# Patient Record
Sex: Female | Born: 1960 | Race: White | Hispanic: No | Marital: Married | Smoking: Former smoker
Health system: Southern US, Community
[De-identification: ages and names within clinical notes are randomized; demographics above are authoritative.]

## PROBLEM LIST (undated history)

## (undated) DIAGNOSIS — E785 Hyperlipidemia, unspecified: Secondary | ICD-10-CM

## (undated) DIAGNOSIS — Z9189 Other specified personal risk factors, not elsewhere classified: Secondary | ICD-10-CM

## (undated) DIAGNOSIS — R112 Nausea with vomiting, unspecified: Secondary | ICD-10-CM

## (undated) DIAGNOSIS — B372 Candidiasis of skin and nail: Secondary | ICD-10-CM

## (undated) DIAGNOSIS — F419 Anxiety disorder, unspecified: Secondary | ICD-10-CM

## (undated) DIAGNOSIS — G43909 Migraine, unspecified, not intractable, without status migrainosus: Secondary | ICD-10-CM

## (undated) DIAGNOSIS — Z8041 Family history of malignant neoplasm of ovary: Secondary | ICD-10-CM

## (undated) DIAGNOSIS — Z9889 Other specified postprocedural states: Secondary | ICD-10-CM

## (undated) DIAGNOSIS — G4733 Obstructive sleep apnea (adult) (pediatric): Secondary | ICD-10-CM

## (undated) DIAGNOSIS — Z973 Presence of spectacles and contact lenses: Secondary | ICD-10-CM

## (undated) DIAGNOSIS — M1990 Unspecified osteoarthritis, unspecified site: Secondary | ICD-10-CM

## (undated) DIAGNOSIS — I10 Essential (primary) hypertension: Secondary | ICD-10-CM

## (undated) HISTORY — PX: TONSILLECTOMY: SUR1361

---

## 1998-06-08 ENCOUNTER — Other Ambulatory Visit: Admission: RE | Admit: 1998-06-08 | Discharge: 1998-06-08 | Payer: Self-pay | Admitting: Obstetrics and Gynecology

## 1999-09-19 ENCOUNTER — Other Ambulatory Visit: Admission: RE | Admit: 1999-09-19 | Discharge: 1999-09-19 | Payer: Self-pay | Admitting: Obstetrics and Gynecology

## 2000-08-20 ENCOUNTER — Encounter: Payer: Self-pay | Admitting: Family Medicine

## 2000-08-20 ENCOUNTER — Encounter: Admission: RE | Admit: 2000-08-20 | Discharge: 2000-08-20 | Payer: Self-pay | Admitting: Family Medicine

## 2000-12-02 ENCOUNTER — Other Ambulatory Visit: Admission: RE | Admit: 2000-12-02 | Discharge: 2000-12-02 | Payer: Self-pay | Admitting: Obstetrics and Gynecology

## 2002-05-18 ENCOUNTER — Encounter: Payer: Self-pay | Admitting: Family Medicine

## 2002-05-18 ENCOUNTER — Encounter: Admission: RE | Admit: 2002-05-18 | Discharge: 2002-05-18 | Payer: Self-pay | Admitting: Family Medicine

## 2002-12-13 ENCOUNTER — Ambulatory Visit (HOSPITAL_COMMUNITY): Admission: RE | Admit: 2002-12-13 | Discharge: 2002-12-13 | Payer: Self-pay | Admitting: Family Medicine

## 2002-12-13 ENCOUNTER — Encounter: Payer: Self-pay | Admitting: Family Medicine

## 2003-01-07 ENCOUNTER — Other Ambulatory Visit: Admission: RE | Admit: 2003-01-07 | Discharge: 2003-01-07 | Payer: Self-pay | Admitting: Obstetrics and Gynecology

## 2003-10-13 ENCOUNTER — Encounter: Admission: RE | Admit: 2003-10-13 | Discharge: 2003-10-13 | Payer: Self-pay | Admitting: Family Medicine

## 2004-02-29 ENCOUNTER — Other Ambulatory Visit: Admission: RE | Admit: 2004-02-29 | Discharge: 2004-02-29 | Payer: Self-pay | Admitting: Obstetrics and Gynecology

## 2004-03-11 ENCOUNTER — Emergency Department (HOSPITAL_COMMUNITY): Admission: EM | Admit: 2004-03-11 | Discharge: 2004-03-11 | Payer: Self-pay | Admitting: Emergency Medicine

## 2004-08-14 ENCOUNTER — Other Ambulatory Visit: Admission: RE | Admit: 2004-08-14 | Discharge: 2004-08-14 | Payer: Self-pay | Admitting: Obstetrics and Gynecology

## 2005-09-13 ENCOUNTER — Encounter: Admission: RE | Admit: 2005-09-13 | Discharge: 2005-09-13 | Payer: Self-pay | Admitting: Family Medicine

## 2006-04-14 ENCOUNTER — Encounter: Admission: RE | Admit: 2006-04-14 | Discharge: 2006-04-14 | Payer: Self-pay | Admitting: Orthopaedic Surgery

## 2006-06-05 ENCOUNTER — Encounter: Admission: RE | Admit: 2006-06-05 | Discharge: 2006-06-05 | Payer: Self-pay | Admitting: Orthopaedic Surgery

## 2006-10-04 ENCOUNTER — Encounter: Admission: RE | Admit: 2006-10-04 | Discharge: 2006-10-04 | Payer: Self-pay | Admitting: Orthopaedic Surgery

## 2006-11-24 ENCOUNTER — Encounter: Admission: RE | Admit: 2006-11-24 | Discharge: 2006-11-24 | Payer: Self-pay | Admitting: Orthopaedic Surgery

## 2006-12-18 ENCOUNTER — Encounter: Admission: RE | Admit: 2006-12-18 | Discharge: 2006-12-18 | Payer: Self-pay | Admitting: Orthopaedic Surgery

## 2007-12-16 ENCOUNTER — Encounter: Admission: RE | Admit: 2007-12-16 | Discharge: 2007-12-16 | Payer: Self-pay | Admitting: Family Medicine

## 2008-01-13 ENCOUNTER — Encounter: Admission: RE | Admit: 2008-01-13 | Discharge: 2008-01-13 | Payer: Self-pay | Admitting: Family Medicine

## 2008-02-16 ENCOUNTER — Encounter: Admission: RE | Admit: 2008-02-16 | Discharge: 2008-02-16 | Payer: Self-pay | Admitting: Family Medicine

## 2009-07-24 ENCOUNTER — Encounter: Admission: RE | Admit: 2009-07-24 | Discharge: 2009-07-24 | Payer: Self-pay | Admitting: Family Medicine

## 2010-08-22 ENCOUNTER — Encounter: Payer: Self-pay | Admitting: Sports Medicine

## 2010-09-12 ENCOUNTER — Ambulatory Visit: Payer: Self-pay | Admitting: Sports Medicine

## 2010-09-12 DIAGNOSIS — M7750 Other enthesopathy of unspecified foot: Secondary | ICD-10-CM | POA: Insufficient documentation

## 2010-09-12 DIAGNOSIS — M79609 Pain in unspecified limb: Secondary | ICD-10-CM | POA: Insufficient documentation

## 2010-10-28 ENCOUNTER — Encounter: Payer: Self-pay | Admitting: Orthopaedic Surgery

## 2010-11-06 NOTE — Letter (Signed)
Summary: *Consult Note  [facility]  [address]   [place], [place] [address]   Phone: [phone]  Fax: [phone]    Re:    Kayla Daugherty [facility] DOB:    [birth year] [name], MD [facility] Fax  [phone]  09/12/10   Dear Selena Batten:    Thank you for requesting that we see the above patient for consultation.  A copy of the detailed office note will be sent under separate cover, for your review.  Evaluation today is consistent with:  1)  METATARSALGIA (ICD-726.70) 2)  FOOT PAIN, RIGHT (ICD-729.5)   Our recommendation is for: MT pads and cushioned insoles in all shoes.  I doubt she will need custom orthotics unless this strategy failed.   New Orders include:  1)  Consultation Level II [40102]   Thank you for this consultation.  If you have any further questions regarding the care of this patient, please do not hesitate to contact me @ [phone].  Thank you for this opportunity to look after your patient.  Sincerely,  Vincent Gros MD

## 2010-11-06 NOTE — Assessment & Plan Note (Signed)
Summary: NP,RT FOOT PAIN,MC   Vital Signs:  Patient profile:   50 year old female Height:      66 inches Weight:      165 pounds BMI:     26.73 BP sitting:   116 / 88  Vitals Entered By: Lillia Pauls CMA (September 12, 2010 11:21 AM)   History of Present Illness: Hyperextended RT 2nd toe 1 mo ago  getting up from kneeling position pain at first on top now numbness and sxs on bottom distal to MT  walks a lot at work including steps [facility] - walks on concrete floors much of day - [employer]  Seen by Dr Clelia Croft who suspected metatarsalgia and sent her for my evaluation    Preventive Screening-Counseling & Management  Alcohol-Tobacco     Smoking Status: never  Allergies (verified): 1)  ! Tetracycline  Past History:  Past Medical History: HX of L5 disk probs that started 8 yrs ago has had injections  Meds - Topamax 100 mgm migraine prevention Valium - 2 mg for sleep  Social History: Smoking Status:  never  Physical Exam  General:  Well-developed,well-nourished,in no acute distress; alert,appropriate and cooperative throughout examination Msk:  fet show loss of transverse arch bilat there is no real splaying of toes but some early morton's callus bilat area of numbness on RT distal to MT heads 2,3 on palpation MT heads are prominent on 3,4 on RT and on 2,3 left  long arch is moderaetely flattened  no TTP over PF  gait show ext rotation of RT foot w pronation bilat   Impression & Recommendations:  Problem # 1:  FOOT PAIN, RIGHT (ICD-729.5) Assessment New This side is symptomatic and after placemnt of MT on sports insoles she had some relief of pain immediately  I would work on pad placement and did not advise any specific meds  Problem # 2:  METATARSALGIA (ICD-726.70) small MT pads added to sixe 7.5 sports insole  try these 1 mo if not responding we can recheck she is geven extra MT pads incase she needs to move left one slightly  if this strategy works  cont to use this type of support  given catologues to order these  copy to Dr Clelia Croft   Orders Added: 1)  Consultation Level II [10626]

## 2010-11-06 NOTE — Consult Note (Signed)
Summary: Kayla Daugherty at [facility]   Imported By: Marily Memos 08/27/2010 08:51:00  _____________________________________________________________________  External Attachment:    Type:   Image     Comment:   External Document

## 2011-05-09 ENCOUNTER — Emergency Department (HOSPITAL_COMMUNITY)
Admission: EM | Admit: 2011-05-09 | Discharge: 2011-05-09 | Disposition: A | Discharge status: Home / Self Care | Payer: BC Managed Care – PPO | Attending: Emergency Medicine | Admitting: Emergency Medicine

## 2011-05-09 ENCOUNTER — Emergency Department (HOSPITAL_COMMUNITY): Payer: BC Managed Care – PPO

## 2011-05-09 DIAGNOSIS — E876 Hypokalemia: Secondary | ICD-10-CM | POA: Insufficient documentation

## 2011-05-09 DIAGNOSIS — R0789 Other chest pain: Secondary | ICD-10-CM | POA: Insufficient documentation

## 2011-05-09 LAB — BASIC METABOLIC PANEL
BUN: 11 mg/dL (ref 6–23)
CO2: 22 mEq/L (ref 19–32)
Calcium: 9.4 mg/dL (ref 8.4–10.5)
Chloride: 104 mEq/L (ref 96–112)
Creatinine, Ser: 0.67 mg/dL (ref 0.50–1.10)
GFR calc Af Amer: >60 mL/min (ref >60)
GFR calc non Af Amer: >60 mL/min (ref >60)
Glucose, Bld: 103 mg/dL — ABNORMAL HIGH (ref 70–99)
Potassium: 3.1 mEq/L — ABNORMAL LOW (ref 3.5–5.1)
Sodium: 136 mEq/L (ref 135–145)

## 2011-05-09 LAB — DIFFERENTIAL
Basophils Absolute: 0 10*3/uL (ref 0.0–0.1)
Basophils Relative: 0 % (ref 0–1)
Eosinophils Absolute: 0.2 10*3/uL (ref 0.0–0.7)
Eosinophils Relative: 2 % (ref 0–5)
Lymphocytes Relative: 30 % (ref 12–46)
Lymphs Abs: 3 10*3/uL (ref 0.7–4.0)
Monocytes Absolute: 0.9 10*3/uL (ref 0.1–1.0)
Monocytes Relative: 9 % (ref 3–12)
Neutro Abs: 5.9 10*3/uL (ref 1.7–7.7)
Neutrophils Relative %: 59 % (ref 43–77)

## 2011-05-09 LAB — CBC
HCT: 34.4 % — ABNORMAL LOW (ref 36.0–46.0)
Hemoglobin: 11.5 g/dL — ABNORMAL LOW (ref 12.0–15.0)
MCH: 28.6 pg (ref 26.0–34.0)
MCHC: 33.4 g/dL (ref 30.0–36.0)
MCV: 85.6 fL (ref 78.0–100.0)
Platelets: 367 10*3/uL (ref 150–400)
RBC: 4.02 MIL/uL (ref 3.87–5.11)
RDW: 13.2 % (ref 11.5–15.5)
WBC: 10.1 10*3/uL (ref 4.0–10.5)

## 2011-05-09 LAB — PROTIME-INR
INR: 1.07 (ref 0.00–1.49)
Prothrombin Time: 14.1 seconds (ref 11.6–15.2)

## 2011-05-09 LAB — CK TOTAL AND CKMB (NOT AT ARMC)
CK, MB: 1.6 ng/mL (ref 0.3–4.0)
Relative Index: INVALID (ref 0.0–2.5)
Total CK: 99 U/L (ref 7–177)

## 2011-05-09 LAB — TROPONIN I: Troponin I: <0.3 ng/mL (ref <0.30)

## 2011-10-08 HISTORY — PX: COLONOSCOPY: SHX174

## 2013-09-27 ENCOUNTER — Other Ambulatory Visit: Payer: Self-pay | Admitting: Obstetrics and Gynecology

## 2013-09-27 DIAGNOSIS — Z803 Family history of malignant neoplasm of breast: Secondary | ICD-10-CM

## 2013-10-06 ENCOUNTER — Ambulatory Visit
Admission: RE | Admit: 2013-10-06 | Discharge: 2013-10-06 | Disposition: A | Discharge status: Home / Self Care | Payer: BC Managed Care – PPO | Source: Ambulatory Visit | Attending: Obstetrics and Gynecology | Admitting: Obstetrics and Gynecology

## 2013-10-06 DIAGNOSIS — Z803 Family history of malignant neoplasm of breast: Secondary | ICD-10-CM

## 2013-10-06 MED ORDER — GADOBENATE DIMEGLUMINE 529 MG/ML IV SOLN
15.0000 mL | Freq: Once | INTRAVENOUS | Status: AC | PRN
  Administered 2013-10-06: 15 mL via INTRAVENOUS

## 2013-11-05 ENCOUNTER — Other Ambulatory Visit: Payer: Self-pay | Admitting: Family Medicine

## 2013-11-05 ENCOUNTER — Ambulatory Visit
Admission: RE | Admit: 2013-11-05 | Discharge: 2013-11-05 | Disposition: A | Discharge status: Home / Self Care | Payer: BC Managed Care – PPO | Source: Ambulatory Visit | Attending: Family Medicine | Admitting: Family Medicine

## 2013-11-05 DIAGNOSIS — M542 Cervicalgia: Secondary | ICD-10-CM

## 2013-11-18 ENCOUNTER — Other Ambulatory Visit: Payer: Self-pay | Admitting: Family Medicine

## 2013-11-18 DIAGNOSIS — M542 Cervicalgia: Secondary | ICD-10-CM

## 2013-11-21 ENCOUNTER — Ambulatory Visit
Admission: RE | Admit: 2013-11-21 | Discharge: 2013-11-21 | Disposition: A | Discharge status: Home / Self Care | Payer: BC Managed Care – PPO | Source: Ambulatory Visit | Attending: Family Medicine | Admitting: Family Medicine

## 2013-11-21 DIAGNOSIS — M542 Cervicalgia: Secondary | ICD-10-CM

## 2013-11-30 ENCOUNTER — Other Ambulatory Visit: Payer: BC Managed Care – PPO

## 2014-02-15 ENCOUNTER — Ambulatory Visit
Admission: RE | Admit: 2014-02-15 | Discharge: 2014-02-15 | Disposition: A | Discharge status: Home / Self Care | Payer: BC Managed Care – PPO | Source: Ambulatory Visit | Attending: Family Medicine | Admitting: Family Medicine

## 2014-02-15 ENCOUNTER — Other Ambulatory Visit: Payer: Self-pay | Admitting: Family Medicine

## 2014-02-15 DIAGNOSIS — R52 Pain, unspecified: Secondary | ICD-10-CM

## 2014-05-14 ENCOUNTER — Encounter (HOSPITAL_BASED_OUTPATIENT_CLINIC_OR_DEPARTMENT_OTHER): Payer: Self-pay | Admitting: Emergency Medicine

## 2014-05-14 ENCOUNTER — Emergency Department (HOSPITAL_BASED_OUTPATIENT_CLINIC_OR_DEPARTMENT_OTHER)
Admission: EM | Admit: 2014-05-14 | Discharge: 2014-05-14 | Disposition: A | Discharge status: Home / Self Care | Payer: BC Managed Care – PPO | Attending: Emergency Medicine | Admitting: Emergency Medicine

## 2014-05-14 DIAGNOSIS — H5710 Ocular pain, unspecified eye: Secondary | ICD-10-CM | POA: Insufficient documentation

## 2014-05-14 DIAGNOSIS — G43909 Migraine, unspecified, not intractable, without status migrainosus: Secondary | ICD-10-CM | POA: Insufficient documentation

## 2014-05-14 DIAGNOSIS — Z79899 Other long term (current) drug therapy: Secondary | ICD-10-CM | POA: Insufficient documentation

## 2014-05-14 DIAGNOSIS — H5704 Mydriasis: Secondary | ICD-10-CM

## 2014-05-14 HISTORY — DX: Migraine, unspecified, not intractable, without status migrainosus: G43.909

## 2014-05-14 NOTE — ED Notes (Signed)
Patient states that yesterday she had a headache and she noticed her pupils were dilated. Saw her employee nurse who gave her medication for a migraine and told her to be seen in the ER if her pupils were still large today.

## 2014-05-14 NOTE — Discharge Instructions (Signed)

## 2014-05-14 NOTE — ED Provider Notes (Signed)
CSN: [id number]     Arrival date & time 05/14/14  1507 History  This chart was scribed for Toy CookeyMegan Marjon Doxtater, MD by Elon SpannerGarrett Cook, ED Scribe. This patient was seen in room MH02/MH02 and the patient's care was started at 4:16 PM.    Chief Complaint  Patient presents with  . Eye Problem    Patient is a 53 y.o. female presenting with eye problem. The history is provided by the patient. No language interpreter was used.  Eye Problem Location:  Both Severity:  Moderate Onset quality:  Sudden Duration:  1 day Timing:  Constant Progression:  Unchanged Chronicity:  New Relieved by:  Nothing Worsened by:  Nothing tried Ineffective treatments:  None tried (Lortab and prednisone) Associated symptoms: no discharge, no headaches, no nausea, no numbness, no photophobia, no vomiting and no weakness     HPI Comments: Kayla Daugherty is a 53 y.o. female with a history of migraines who presents to the Emergency Department complaining of a constant, unchanged eye problem that began yesterday morning.  Patient states that she noticed her eye was bulging yesterday morning and went to her work-associated clinic where she was prescribed Lortab and Prednisone.  She was told to follow-up in the ED and ask for a CT-scan if the problem did not resolve.  Patient states that she has also been she has been experiencing a headache for approximately 3 days that has currently resolved.  Patient is prescribed medication for chronic migraines and states she is compliant.  The patient notes that while her headache has subsided, she is currently experiencing mild pressure bilaterally on her eye brows.  Patient denies recent changes in her prescribed medications.  Patient denies numbness, ataxia, dysphagia, fever, cough, swallowing, difficulty speaking, recent surgery, other medical problems.    Past Medical History  Diagnosis Date  . Migraines    History reviewed. No pertinent past surgical history. No family history on  file. History  Substance Use Topics  . Smoking status: Not on file  . Smokeless tobacco: Not on file  . Alcohol Use: Not on file   OB History   Grav Para Term Preterm Abortions TAB SAB Ect Mult Living                 Review of Systems  Constitutional: Negative for fever, chills, diaphoresis, activity change, appetite change and fatigue.  HENT: Negative for congestion, facial swelling, rhinorrhea and sore throat.   Eyes: Positive for pain. Negative for photophobia and discharge.  Respiratory: Negative for cough, chest tightness and shortness of breath.   Cardiovascular: Negative for chest pain, palpitations and leg swelling.  Gastrointestinal: Negative for nausea, vomiting, abdominal pain and diarrhea.  Endocrine: Negative for polydipsia and polyuria.  Genitourinary: Negative for dysuria, frequency, difficulty urinating and pelvic pain.  Musculoskeletal: Negative for arthralgias, back pain, neck pain and neck stiffness.  Skin: Negative for color change and wound.  Allergic/Immunologic: Negative for immunocompromised state.  Neurological: Negative for facial asymmetry, speech difficulty, weakness, numbness and headaches.  Hematological: Does not bruise/bleed easily.  Psychiatric/Behavioral: Negative for confusion and agitation.      Allergies  Tetracycline  Home Medications   Prior to Admission medications   Medication Sig Start Date End Date Taking? Authorizing Provider  clonazePAM (KLONOPIN) 1 MG tablet Take 1 mg by mouth 2 (two) times daily.   Yes Historical Provider, MD   BP 121/79  Pulse 63  Temp(Src) 97.9 F (36.6 C) (Oral)  Resp 12  Ht 5\' 6"  (1.676  m)  Wt 170 lb (77.111 kg)  BMI 27.45 kg/m2  SpO2 98% Physical Exam  Nursing note and vitals reviewed. Constitutional: She is oriented to person, place, and time. She appears well-developed and well-nourished. No distress.  HENT:  Head: Normocephalic.  Mouth/Throat: Oropharynx is clear and moist.  Eyes:  Conjunctivae and EOM are normal. Pupils are equal, round, and reactive to light. Pupils are equal.  3 mm bilaterally.  Reactive equally.   Neck: Neck supple.  Cardiovascular: Normal rate, regular rhythm and normal heart sounds.   Pulmonary/Chest: Effort normal and breath sounds normal. No respiratory distress. She has no wheezes.  Abdominal: Soft. She exhibits no distension. There is no tenderness. There is no rebound and no guarding.  Musculoskeletal: She exhibits no edema and no tenderness.  Neurological: She is alert and oriented to person, place, and time. She displays no atrophy and no tremor. No cranial nerve deficit or sensory deficit. She exhibits normal muscle tone. She displays no seizure activity. Coordination and gait normal. GCS eye subscore is 4. GCS verbal subscore is 5. GCS motor subscore is 6.  Skin: Skin is warm and dry.  Psychiatric: She has a normal mood and affect.    ED Course  Procedures (including critical care time)  DIAGNOSTIC STUDIES: Oxygen Saturation is 99% on RA, normal by my interpretation.    COORDINATION OF CARE:  4:25 PM Discussed normal neurological exam findings.  Advised patient of return precautions.  Patient acknowledges and agrees with plan.      Labs Review Labs Reviewed - No data to display  Imaging Review No results found.   EKG Interpretation None      MDM   Final diagnoses:  Pupil dilation    Pt is a 53 y.o. female with Pmhx as above who states she was sent by her work Engineer, civil (consulting) to evaluate BL dialated pupils. She states she had a migraine yesterday treated w/ Vicodin and steroids which is much better today, but that she was told if her pupils she still dilated today, she needed to go to the ED. She denies visual changes, numbness, weakness, confusion, trouble speaking or walking, fevers. She has had no trauma.  Her h/a is now mild. On PE, VSS, pt in NAD. Pupils are 3mm, equal, round and reactive to light w/ nml EOM.  She has no focal  neuro findings. I do not feel she has an intracranial lesion/mass effect that would explain BL pupillary dilation w/o headache or focal neuro findings and therefore do not feel she requires neuro imaging. I feel this mydriasis is benign, likely medication related. Return precautions given for new or worsening symptoms including worsening h/a, focal neuro complaints.    I personally performed the services described in this documentation, which was scribed in my presence. The recorded information has been reviewed and is accurate.     Toy Cookey, MD 05/14/14 2049

## 2014-05-31 ENCOUNTER — Other Ambulatory Visit: Payer: Self-pay | Admitting: Family Medicine

## 2014-05-31 DIAGNOSIS — R51 Headache: Secondary | ICD-10-CM

## 2014-05-31 DIAGNOSIS — H5704 Mydriasis: Secondary | ICD-10-CM

## 2014-06-06 ENCOUNTER — Ambulatory Visit
Admission: RE | Admit: 2014-06-06 | Discharge: 2014-06-06 | Disposition: A | Discharge status: Home / Self Care | Payer: BC Managed Care – PPO | Source: Ambulatory Visit | Attending: Family Medicine | Admitting: Family Medicine

## 2014-06-06 DIAGNOSIS — H5704 Mydriasis: Secondary | ICD-10-CM

## 2014-06-06 DIAGNOSIS — R51 Headache: Secondary | ICD-10-CM

## 2014-06-06 MED ORDER — IOHEXOL 300 MG/ML  SOLN
75.0000 mL | Freq: Once | INTRAMUSCULAR | Status: AC | PRN
  Administered 2014-06-06: 75 mL via INTRAVENOUS

## 2015-01-04 ENCOUNTER — Encounter (HOSPITAL_BASED_OUTPATIENT_CLINIC_OR_DEPARTMENT_OTHER): Payer: Self-pay | Admitting: *Deleted

## 2015-01-04 NOTE — H&P (Signed)
  Patient name  Kayla Daugherty, Kayla Daugherty DICTATION#  [id number] CSN# [id number]  Kayla Daugherty,Kayla Lofaso S, MD 01/04/2015 10:27 AM

## 2015-01-04 NOTE — Progress Notes (Signed)
NPO AFTER MN. ARRIVE AT 0600. WILL GET LAB DONE Friday 01-06-2015.  MAY TAKE IMITREX IF NEEDED AM DOS W/ SIPS OF WATER.

## 2015-01-05 NOTE — H&P (Signed)
Kayla Daugherty, RODOCKER NO.:  [id number]  MEDICAL RECORD NO.:  [id number]  LOCATION:                                 FACILITY:  PHYSICIAN:  Darlyn Chamber, M.D.   DATE OF BIRTH:  [birth year]  DATE OF ADMISSION:  01/10/2015 DATE OF DISCHARGE:                             HISTORY & PHYSICAL   Her surgery is scheduled for January 10, 2015, at [facility] outpatient area on [address].  HISTORY OF PRESENT ILLNESS:  The patient is a 54 year old nulligravida female who presents for laparoscopic bilateral salpingo-oophorectomy. Indications for this.  She did have a paternal aunt with breast and ovarian cancer.  Had another paternal aunt with breast cancer.  She did undergo BRCA testing was negative.  She has been doing ultrasounds and CA-125 every 6 months that was negative.  He has decided at the present time due to her increased risk of ovarian cancer.  She undergo laparoscopic bilateral salpingo-oophorectomy.  The option of adding hysterectomy was discussed along with its risks and benefits.  She declines that at the present time.  ALLERGIES:  She is allergic to TETRACYCLINE.  MEDICATIONS:  She is on simvastatin 20 mg daily,  Klonopin 1 mg daily, topiramate 50 mg, and Zoloft.  PAST MEDICAL HISTORY:  Does have a history of hyperlipidemia, under active management.  Also, a history of migraine headaches.  PREVIOUS SURGERIES:  Tonsillectomy.  SOCIAL HISTORY:  Reveals no tobacco or alcohol use.  FAMILY HISTORY:  Father with a history of diverticulosis and peptic ulcer disease.  REVIEW OF SYSTEMS:  Noncontributory.  PHYSICAL EXAM:  VITAL SIGNS:  The patient is afebrile, stable vital signs. HEENT:  The patient is normocephalic.  Pupils equal, round, and reactive to light and accommodation.  Extraocular movements were intact.  Sclerae and conjunctivae are clear.  Oropharynx clear. NECK:  Without thyromegaly. BREASTS:  No discrete masses. LUNGS:   Clear. CARDIOVASCULAR:  Regular rhythm and rate.  There are no murmurs or gallops.  No carotid or abdominal bruits. ABDOMEN:  Benign.  No mass, organomegaly, or tenderness. PELVIC:  Normal external genitalia.  Vaginal mucosa is clear.  Cervix unremarkable.  Uterus normal size, shape, and contour.  Adnexa free of mass or tenderness. EXTREMITIES:  Trace edema.  NEUROLOGIC:  Grossly within normal limits.  IMPRESSION:  Family history of both breast and ovarian cancer given the patient new increased risk of ovarian cancer.  PLAN:  The patient will undergo laparoscopic and bilateral salpingo- oophorectomy.  Uterus with a left in place per patient's request.  The risks of surgery have been explained including the risk of infection. The risk of hemorrhage that could require transfusion with the risk of AIDS or hepatitis.  Risk of injury to adjacent organs including bladder, bowel, ureters that could require further exploratory surgery.  Risk of deep venous thrombosis and pulmonary embolus.  Alternatives such as continued ultrasounds and CA-125 have been discussed.  Their limitations have been explained.  We are going to do a cystoscopy after the procedure to make sure the bladder, ureters, all packed.  The patient expressed understanding of indications, risks, and alternatives.     Darlyn Chamber, M.D.  JSM/MEDQ  D:  01/04/2015  T:  01/04/2015  Job:  [id number]

## 2015-01-06 ENCOUNTER — Other Ambulatory Visit (HOSPITAL_COMMUNITY)
Admission: RE | Admit: 2015-01-06 | Discharge: 2015-01-06 | Disposition: A | Discharge status: Home / Self Care | Payer: BLUE CROSS/BLUE SHIELD | Source: Ambulatory Visit | Attending: Obstetrics and Gynecology | Admitting: Obstetrics and Gynecology

## 2015-01-06 DIAGNOSIS — Z419 Encounter for procedure for purposes other than remedying health state, unspecified: Secondary | ICD-10-CM | POA: Insufficient documentation

## 2015-01-06 LAB — CBC
HCT: 39.2 % (ref 36.0–46.0)
Hemoglobin: 13.3 g/dL (ref 12.0–15.0)
MCH: 31.1 pg (ref 26.0–34.0)
MCHC: 33.9 g/dL (ref 30.0–36.0)
MCV: 91.6 fL (ref 78.0–100.0)
Platelets: 357 10*3/uL (ref 150–400)
RBC: 4.28 MIL/uL (ref 3.87–5.11)
RDW: 12.5 % (ref 11.5–15.5)
WBC: 8.7 10*3/uL (ref 4.0–10.5)

## 2015-01-06 LAB — COMPREHENSIVE METABOLIC PANEL
ALT: 37 U/L — ABNORMAL HIGH (ref 0–35)
AST: 29 U/L (ref 0–37)
Albumin: 4.6 g/dL (ref 3.5–5.2)
Alkaline Phosphatase: 86 U/L (ref 39–117)
Anion gap: 12 (ref 5–15)
BUN: 13 mg/dL (ref 6–23)
CO2: 25 mmol/L (ref 19–32)
Calcium: 9.7 mg/dL (ref 8.4–10.5)
Chloride: 104 mmol/L (ref 96–112)
Creatinine, Ser: 0.9 mg/dL (ref 0.50–1.10)
GFR calc Af Amer: 83 mL/min — ABNORMAL LOW (ref >90)
GFR calc non Af Amer: 72 mL/min — ABNORMAL LOW (ref >90)
Glucose, Bld: 90 mg/dL (ref 70–99)
Potassium: 4 mmol/L (ref 3.5–5.1)
Sodium: 141 mmol/L (ref 135–145)
Total Bilirubin: 0.3 mg/dL (ref 0.3–1.2)
Total Protein: 8 g/dL (ref 6.0–8.3)

## 2015-01-10 ENCOUNTER — Encounter (HOSPITAL_BASED_OUTPATIENT_CLINIC_OR_DEPARTMENT_OTHER)
Admission: RE | Disposition: A | Discharge status: Home / Self Care | Payer: Self-pay | Source: Ambulatory Visit | Attending: Obstetrics and Gynecology

## 2015-01-10 ENCOUNTER — Encounter (HOSPITAL_BASED_OUTPATIENT_CLINIC_OR_DEPARTMENT_OTHER): Payer: Self-pay | Admitting: *Deleted

## 2015-01-10 ENCOUNTER — Ambulatory Visit (HOSPITAL_BASED_OUTPATIENT_CLINIC_OR_DEPARTMENT_OTHER)
Admission: RE | Admit: 2015-01-10 | Discharge: 2015-01-10 | Disposition: A | Discharge status: Home / Self Care | Payer: BLUE CROSS/BLUE SHIELD | Source: Ambulatory Visit | Attending: Obstetrics and Gynecology | Admitting: Obstetrics and Gynecology

## 2015-01-10 ENCOUNTER — Ambulatory Visit (HOSPITAL_BASED_OUTPATIENT_CLINIC_OR_DEPARTMENT_OTHER): Payer: BLUE CROSS/BLUE SHIELD | Admitting: Anesthesiology

## 2015-01-10 DIAGNOSIS — Z803 Family history of malignant neoplasm of breast: Secondary | ICD-10-CM | POA: Insufficient documentation

## 2015-01-10 DIAGNOSIS — Z87891 Personal history of nicotine dependence: Secondary | ICD-10-CM | POA: Diagnosis not present

## 2015-01-10 DIAGNOSIS — Z8041 Family history of malignant neoplasm of ovary: Secondary | ICD-10-CM | POA: Insufficient documentation

## 2015-01-10 DIAGNOSIS — Z4002 Encounter for prophylactic removal of ovary: Secondary | ICD-10-CM | POA: Diagnosis present

## 2015-01-10 DIAGNOSIS — G43909 Migraine, unspecified, not intractable, without status migrainosus: Secondary | ICD-10-CM | POA: Diagnosis not present

## 2015-01-10 DIAGNOSIS — E785 Hyperlipidemia, unspecified: Secondary | ICD-10-CM | POA: Insufficient documentation

## 2015-01-10 DIAGNOSIS — Z90722 Acquired absence of ovaries, bilateral: Secondary | ICD-10-CM

## 2015-01-10 DIAGNOSIS — Z9889 Other specified postprocedural states: Secondary | ICD-10-CM

## 2015-01-10 DIAGNOSIS — G4733 Obstructive sleep apnea (adult) (pediatric): Secondary | ICD-10-CM | POA: Diagnosis not present

## 2015-01-10 HISTORY — PX: LAPAROSCOPIC SALPINGO OOPHERECTOMY: SHX5927

## 2015-01-10 HISTORY — DX: Presence of spectacles and contact lenses: Z97.3

## 2015-01-10 HISTORY — PX: CYSTOSCOPY: SHX5120

## 2015-01-10 HISTORY — DX: Other specified personal risk factors, not elsewhere classified: Z91.89

## 2015-01-10 HISTORY — DX: Family history of malignant neoplasm of ovary: Z80.41

## 2015-01-10 HISTORY — DX: Candidiasis of skin and nail: B37.2

## 2015-01-10 HISTORY — DX: Hyperlipidemia, unspecified: E78.5

## 2015-01-10 HISTORY — DX: Obstructive sleep apnea (adult) (pediatric): G47.33

## 2015-01-10 SURGERY — SALPINGO-OOPHORECTOMY, LAPAROSCOPIC
Anesthesia: General | Site: Bladder | Laterality: Bilateral

## 2015-01-10 MED ORDER — HYDROMORPHONE HCL 1 MG/ML IJ SOLN
INTRAMUSCULAR | Status: AC
  Filled 2015-01-10: qty 1

## 2015-01-10 MED ORDER — NEOSTIGMINE METHYLSULFATE 10 MG/10ML IV SOLN
INTRAVENOUS | Status: DC | PRN
  Administered 2015-01-10: 3 mg via INTRAVENOUS

## 2015-01-10 MED ORDER — FLUORESCEIN SODIUM 10 % IJ SOLN
INTRAMUSCULAR | Status: DC | PRN
  Administered 2015-01-10: 500 mg via INTRAVENOUS

## 2015-01-10 MED ORDER — SODIUM CHLORIDE 0.9 % IR SOLN
Status: DC | PRN
  Administered 2015-01-10: 1000 mL

## 2015-01-10 MED ORDER — CEFAZOLIN SODIUM-DEXTROSE 2-3 GM-% IV SOLR
2.0000 g | INTRAVENOUS | Status: AC
  Administered 2015-01-10: 2 g via INTRAVENOUS
  Filled 2015-01-10: qty 50

## 2015-01-10 MED ORDER — FENTANYL CITRATE 0.05 MG/ML IJ SOLN
INTRAMUSCULAR | Status: DC | PRN
  Administered 2015-01-10 (×2): 50 ug via INTRAVENOUS
  Administered 2015-01-10 (×2): 25 ug via INTRAVENOUS
  Administered 2015-01-10: 50 ug via INTRAVENOUS

## 2015-01-10 MED ORDER — HYDROMORPHONE HCL 1 MG/ML IJ SOLN
0.2500 mg | INTRAMUSCULAR | Status: DC | PRN
  Administered 2015-01-10 (×2): 0.25 mg via INTRAVENOUS
  Filled 2015-01-10: qty 1

## 2015-01-10 MED ORDER — LACTATED RINGERS IV SOLN
INTRAVENOUS | Status: DC
  Administered 2015-01-10 (×3): via INTRAVENOUS
  Filled 2015-01-10: qty 1000

## 2015-01-10 MED ORDER — SUCCINYLCHOLINE CHLORIDE 20 MG/ML IJ SOLN
INTRAMUSCULAR | Status: DC | PRN
  Administered 2015-01-10: 100 mg via INTRAVENOUS

## 2015-01-10 MED ORDER — MIDAZOLAM HCL 5 MG/5ML IJ SOLN
INTRAMUSCULAR | Status: DC | PRN
  Administered 2015-01-10: 2 mg via INTRAVENOUS

## 2015-01-10 MED ORDER — ONDANSETRON HCL 4 MG/2ML IJ SOLN
INTRAMUSCULAR | Status: DC | PRN
Start: 2015-01-10 — End: 2015-01-10
  Administered 2015-01-10: 4 mg via INTRAVENOUS

## 2015-01-10 MED ORDER — SCOPOLAMINE 1 MG/3DAYS TD PT72
MEDICATED_PATCH | TRANSDERMAL | Status: AC
  Filled 2015-01-10: qty 1

## 2015-01-10 MED ORDER — MIDAZOLAM HCL 2 MG/2ML IJ SOLN
INTRAMUSCULAR | Status: AC
  Filled 2015-01-10: qty 2

## 2015-01-10 MED ORDER — LIDOCAINE HCL (CARDIAC) 20 MG/ML IV SOLN
INTRAVENOUS | Status: DC | PRN
  Administered 2015-01-10: 80 mg via INTRAVENOUS

## 2015-01-10 MED ORDER — LACTATED RINGERS IR SOLN
Status: DC | PRN
  Administered 2015-01-10: 3000 mL

## 2015-01-10 MED ORDER — PROPOFOL 10 MG/ML IV BOLUS
INTRAVENOUS | Status: DC | PRN
  Administered 2015-01-10: 150 mg via INTRAVENOUS

## 2015-01-10 MED ORDER — CEFAZOLIN SODIUM-DEXTROSE 2-3 GM-% IV SOLR
INTRAVENOUS | Status: AC
  Filled 2015-01-10: qty 50

## 2015-01-10 MED ORDER — KETOROLAC TROMETHAMINE 30 MG/ML IJ SOLN
INTRAMUSCULAR | Status: DC | PRN
  Administered 2015-01-10: 30 mg via INTRAVENOUS

## 2015-01-10 MED ORDER — GLYCOPYRROLATE 0.2 MG/ML IJ SOLN
INTRAMUSCULAR | Status: DC | PRN
  Administered 2015-01-10: 0.4 mg via INTRAVENOUS

## 2015-01-10 MED ORDER — FENTANYL CITRATE 0.05 MG/ML IJ SOLN
INTRAMUSCULAR | Status: AC
  Filled 2015-01-10: qty 6

## 2015-01-10 MED ORDER — DEXAMETHASONE SODIUM PHOSPHATE 4 MG/ML IJ SOLN
INTRAMUSCULAR | Status: DC | PRN
  Administered 2015-01-10: 10 mg via INTRAVENOUS

## 2015-01-10 MED ORDER — SCOPOLAMINE 1 MG/3DAYS TD PT72
1.0000 | MEDICATED_PATCH | Freq: Once | TRANSDERMAL | Status: DC
  Administered 2015-01-10: 1.5 mg via TRANSDERMAL
  Filled 2015-01-10: qty 1

## 2015-01-10 MED ORDER — OXYCODONE-ACETAMINOPHEN 7.5-325 MG PO TABS: 1.0000 | ORAL_TABLET | ORAL | Status: DC | PRN

## 2015-01-10 MED ORDER — BUPIVACAINE HCL 0.25 % IJ SOLN
INTRAMUSCULAR | Status: DC | PRN
  Administered 2015-01-10: 7 mL

## 2015-01-10 MED ORDER — ROCURONIUM BROMIDE 100 MG/10ML IV SOLN
INTRAVENOUS | Status: DC | PRN
  Administered 2015-01-10: 25 mg via INTRAVENOUS

## 2015-01-10 SURGICAL SUPPLY — 47 items
APPLICATOR COTTON TIP 6IN STRL (MISCELLANEOUS) ×3 IMPLANT
BAG URINE DRAINAGE (UROLOGICAL SUPPLIES) IMPLANT
BANDAGE ADHESIVE 1X3 (GAUZE/BANDAGES/DRESSINGS) IMPLANT
BLADE SURG 11 STRL SS (BLADE) ×3 IMPLANT
CANISTER SUCTION 1200CC (MISCELLANEOUS) IMPLANT
CANISTER SUCTION 2500CC (MISCELLANEOUS) ×3 IMPLANT
CATH FOLEY 2WAY SLVR  5CC 14FR (CATHETERS)
CATH FOLEY 2WAY SLVR 5CC 14FR (CATHETERS) IMPLANT
CATH ROBINSON RED A/P 16FR (CATHETERS) IMPLANT
DRESSING TELFA ISLAND 4X8 (GAUZE/BANDAGES/DRESSINGS) IMPLANT
ELECT REM PT RETURN 9FT ADLT (ELECTROSURGICAL) ×3
ELECTRODE REM PT RTRN 9FT ADLT (ELECTROSURGICAL) ×2 IMPLANT
FILTER SMOKE EVAC LAPAROSHD (FILTER) IMPLANT
FOLEY ×3 IMPLANT
GLOVE BIO SURGEON STRL SZ7 (GLOVE) IMPLANT
GLOVE SURG SS PI 7.0 STRL IVOR (GLOVE) ×3 IMPLANT
GLOVE SURG SS PI 7.5 STRL IVOR (GLOVE) ×9 IMPLANT
GOWN STRL REUS W/ TWL XL LVL3 (GOWN DISPOSABLE) ×6 IMPLANT
GOWN STRL REUS W/TWL XL LVL3 (GOWN DISPOSABLE) ×3
LAPAROSCOPY HANDPIECE LONG (MISCELLANEOUS) IMPLANT
LIQUID BAND (GAUZE/BANDAGES/DRESSINGS) ×3 IMPLANT
NEEDLE INSUFFLATION 14GA 120MM (NEEDLE) IMPLANT
NS IRRIG 500ML POUR BTL (IV SOLUTION) ×3 IMPLANT
PACK BASIN DAY SURGERY FS (CUSTOM PROCEDURE TRAY) ×3 IMPLANT
PACK LAPAROSCOPY II (CUSTOM PROCEDURE TRAY) ×3 IMPLANT
PAD OB MATERNITY 4.3X12.25 (PERSONAL CARE ITEMS) ×3 IMPLANT
PAD PREP 24X48 CUFFED NSTRL (MISCELLANEOUS) ×3 IMPLANT
POUCH SPECIMEN RETRIEVAL 10MM (ENDOMECHANICALS) ×6 IMPLANT
SCISSORS LAP 5X35 DISP (ENDOMECHANICALS) IMPLANT
SEALER TISSUE G2 CVD JAW 35 (ENDOMECHANICALS) IMPLANT
SEALER TISSUE G2 CVD JAW 45CM (ENDOMECHANICALS) ×3 IMPLANT
SET IRRIG TUBING LAPAROSCOPIC (IRRIGATION / IRRIGATOR) IMPLANT
SET IRRIG Y TYPE TUR BLADDER L (SET/KITS/TRAYS/PACK) ×3 IMPLANT
SOLUTION ANTI FOG 6CC (MISCELLANEOUS) ×3 IMPLANT
SUT VIC AB 3-0 PS2 18 (SUTURE) ×1
SUT VIC AB 3-0 PS2 18XBRD (SUTURE) ×2 IMPLANT
SUT VICRYL 0 ENDOLOOP (SUTURE) IMPLANT
SUT VICRYL 0 UR6 27IN ABS (SUTURE) IMPLANT
SYRINGE 10CC LL (SYRINGE) IMPLANT
TOWEL OR 17X24 6PK STRL BLUE (TOWEL DISPOSABLE) ×6 IMPLANT
TRAY DSU PREP LF (CUSTOM PROCEDURE TRAY) ×3 IMPLANT
TROCAR BALLN 12MMX100 BLUNT (TROCAR) IMPLANT
TROCAR Z-THREAD BLADED 11X100M (TROCAR) ×6 IMPLANT
TROCAR Z-THREAD BLADED 5X100MM (TROCAR) ×3 IMPLANT
TUBING INSUFFLATION 10FT LAP (TUBING) ×3 IMPLANT
VACUUM HOSE/TUBING 7/8INX6FT (MISCELLANEOUS) IMPLANT
WATER STERILE IRR 500ML POUR (IV SOLUTION) ×3 IMPLANT

## 2015-01-10 NOTE — Op Note (Signed)
NAMFederico Flake:  Tardif, Bess             ACCOUNT NO.:  [id number]  MEDICAL RECORD NO.:  [id number]  LOCATION:                                 FACILITY:  PHYSICIAN:  Juluis MireJohn S. Paul Trettin, M.D.   DATE OF BIRTH:  16-Nov-1960  DATE OF PROCEDURE:  01/10/2015 DATE OF DISCHARGE:                              OPERATIVE REPORT   PREOPERATIVE DIAGNOSIS:  Family history of ovarian cancer, putting the patient at increased risk of ovarian cancer.  OPERATIVE PROCEDURES:  Laparoscopic bilateral salpingo-oophorectomy. Cystoscopy.  SURGEON:  Juluis MireJohn S. Arish Redner, M.D.  ANESTHESIA:  General tracheal.  ESTIMATED BLOOD LOSS:  Minimal.  PACKS:  None.  DRAINS:  None.  INTRAOPERATIVE BLOOD PLACES:  None.  COMPLICATIONS:  None.  INDICATIONS:  Dictated in history and physical.  DESCRIPTION OF PROCEDURE:  The patient was taken to the OR.  After satisfactory level of general endotracheal anesthesia was obtained, the patient was placed in the dorsal lithotomy position using the Allen stirrups.  The abdomen, perineum, and vagina were prepped out with Betadine.  Bladder was emptied by in-and-out catheterization.  A Hulka tenaculum was put in place and secured.  The patient was then draped in sterile field.  A subumbilical incision was made with a knife.  The Veress needle was introduced in the abdominal cavity.  The abdomen was inflated with approximately 3 L of carbon dioxide.  A 10/11 bladed trocar was inserted in the abdominal cavity.  The laparoscope was then introduced.  Visualization revealed no evidence of injury to adjacent organs.  The upper abdomen including liver and tip of the gallbladder were clear.  Both lateral gutters were clear.  I think the appendix was retrocecal and was hard to see.  Uterus, tubes, and ovaries were unremarkable.  A second 10/11 trocar was put in place in suprapubic area under direct visualization, and a 5-mm trocar was put in place in the left lower quadrant after visualization  of the epigastric vessels.  At this point in time, the right ovary was elevated.  Ureter was easily seen in its course along the pelvic sidewall.  Using the EnSeal, the right ovarian vasculature was cauterized, incised.  The mesenteric attachments of the ovary and tube were cauterized, incised up to the uterus.  Next, the tube was cauterized, incised at its junction to the uterus and the right ovarian vasculature was cauterized and incised.  An Endobag was placed through the suprapubic port.  The ovary and tube were placed in the Endobag and removed through the suprapubic area.  The 10/11 trocar was put back into place.  We had good hemostasis at this point in time.  We now went to the left side.  We could see the course of the ureter along the left pelvic sidewall.  The left tube and ovary elevated.  The left ovarian vasculature was cauterized and incised using the Enseal.  Mesenteric attachments of tubes and ovaries were cauterized, incised up to the uterus.  Next, the tube was cauterized, incised at its junction to the uterus.  The left ovarian vasculature was cauterized and incised.  The Endobag was brought back into place.  Left tube and ovary were placed and then removed  through the suprapubic port. The trocar was put back in place.  At this point in time, we irrigated the pelvis.  We did obtain irrigation, sent it for cytology.  The bipolar was brought into place.  We cauterized both areas where the ovarian vasculature had been cauterized and incised.  There was some bleeding from the right side of the uterus that was brought under control with the bipolar.  The tubal areas at both cornea of the uterus were cauterized and incised significantly using the bipolar to destroy any additional tubal tissue that might be in the superficial area of the uterus.  At the end of the procedure, both ovaries and tubes were successfully removed.  We had good hemostasis.  We irrigated the  pelvis, removed the irrigation.  We then deflated the abdomen and reinflated, and no bleeding was encountered.  The abdomen was completely deflated from carbon dioxide.  All trocars were removed.  Subumbilical incision was closed with interrupted subcuticulars of 3-0 Vicryl.  Suprapubic incision was closed with interrupted subcuticulars of 3-0 Vicryl and Dermabond, and the left lower quadrant incision was closed with Dermabond.  The Hulka tenaculum was removed.  Cystoscope was introduced.  The patient was given fluorescein.  On visualization, the bladder was completely intact.  There was no injury.  We could see green-tinged urine coming from both ureteral orifices indicating no damage.  At this point, the bladder was emptied.  The cystoscope was removed.  The patient was taken out of the dorsal lithotomy position.  Once alert, extubated and was transferred to recovery room in good condition. Sponge, instrument, and needle count was correct by circulating nurse x2.     Juluis Mire, M.D.     JSM/MEDQ  D:  01/10/2015  T:  01/10/2015  Job:  [id number]

## 2015-01-10 NOTE — Brief Op Note (Signed)
01/10/2015  8:41 AM  PATIENT:  Kayla Daugherty  54 y.o. female  PRE-OPERATIVE DIAGNOSIS:  family history of ovarian and breast cancer  POST-OPERATIVE DIAGNOSIS:  family history of ovarian and breast cancer  PROCEDURE:  Procedure(s): LAPAROSCOPIC SALPINGO OOPHORECTOMY (Bilateral) CYSTOSCOPY  SURGEON:  Surgeon(s) and Role:    * Richardean ChimeraJohn Malgorzata Albert, MD - Primary  PHYSICIAN ASSISTANT:   ASSISTANTS: none   ANESTHESIA:   general  EBL:  Total I/O In: 1000 [I.V.:1000] Out: -   BLOOD ADMINISTERED:none  DRAINS: none   LOCAL MEDICATIONS USED:  MARCAINE     SPECIMEN:  Source of Specimen:  both tubes and ovaries and washings  DISPOSITION OF SPECIMEN:  PATHOLOGY  COUNTS:  YES  TOURNIQUET:  * No tourniquets in log *  DICTATION: .Other Dictation: Dictation Number [id number]  PLAN OF CARE: Discharge to home after PACU  PATIENT DISPOSITION:  PACU - hemodynamically stable.   Delay start of Pharmacological VTE agent (>24hrs) due to surgical blood loss or risk of bleeding: not applicable

## 2015-01-10 NOTE — Discharge Instructions (Signed)
Bilateral Salpingo-Oophorectomy, Care After Refer to this sheet in the next few weeks. These instructions provide you with information on caring for yourself after your procedure. Your health care provider may also give you more specific instructions. Your treatment has been planned according to current medical practices, but problems sometimes occur. Call your health care provider if you have any problems or questions after your procedure. WHAT TO EXPECT AFTER THE PROCEDURE After your procedure, it is typical to have the following:   Abdominal pain that can be controlled with medicine.  Vaginal spotting.  Constipation.  Menopausal symptoms such as hot flashes, vaginal dryness, and mood swings. HOME CARE INSTRUCTIONS   Get plenty of rest and sleep.  Only take over-the-counter or prescription medicines as directed by your health care provider. Do not take aspirin. It can cause bleeding.  Keep incision areas clean and dry. Remove or change bandages (dressings) only as directed by your health care provider.  Take showers instead of baths for a few weeks as directed by your health care provider.  Limit exercise and activities as directed by your health care provider. Do not lift anything heavier than 5 pounds (2.3 kg) until your health care provider approves.  Do not drive until your health care provider approves.  Follow your health care provider's advice regarding diet. You may be able to resume your usual diet right away.  Drink enough fluids to keep your urine clear or pale yellow.  Do not douche, use tampons, or have sexual intercourse for 6 weeks after the procedure.  Do not drink alcohol until your health care provider says it is okay.  Take your temperature twice a day and write it down.  If you become constipated, you may:  Ask your health care provider about taking a mild laxative.  Add more fruit and bran to your diet.  Drink more fluids.  Follow up with your  health care provider as directed. SEEK MEDICAL CARE IF:   You have swelling, redness, or increasing pain in the incision area.  You see pus coming from the incision area.  You notice a bad smell coming from the wound or dressing.  You have pain, redness, or swelling where the IV access tube was placed.  Your incision is breaking open (the edges are not staying together).  You feel dizzy or feel like fainting.  You develop pain or bleeding when you urinate.  You develop diarrhea.  You develop nausea and vomiting.  You develop abnormal vaginal discharge.  You develop a rash.  You have pain that is not controlled with medicine. SEEK IMMEDIATE MEDICAL CARE IF:   You develop a fever.  You develop abdominal pain.  You have chest pain.  You develop shortness of breath.  You pass out.  You develop pain, swelling, or redness in your leg.  You develop heavy vaginal bleeding with or without blood clots. Document Released: 09/23/2005 Document Revised: 05/26/2013 Document Reviewed: 03/17/2013 [facility] Patient Information 2015 [place], [place]. This information is not intended to replace advice given to you by your health care provider. Make sure you discuss any questions you have with your health care provider.   CYSTOSCOPY HOME CARE INSTRUCTIONS  Activity: Rest for the remainder of the day.  Do not drive or operate equipment today.  You may resume normal activities in one to two days as instructed by your physician.   Meals: Drink plenty of liquids and eat light foods such as gelatin or soup this evening.  You  may return to a normal meal plan tomorrow.  Return to Work: You may return to work in one to two days or as instructed by your physician.  Special Instructions / Symptoms: Call your physician if any of these symptoms occur:   -persistent or heavy bleeding  -bleeding which continues after first few urination  -large blood clots that are difficult to pass  -urine  stream diminishes or stops completely  -fever equal to or higher than 101 degrees Farenheit.  -cloudy urine with a strong, foul odor  -severe pain  Females should always wipe from front to back after elimination.  You may feel some burning pain when you urinate.  This should disappear with time.  Applying moist heat to the lower abdomen or a hot tub bath may help relieve the pain.     Post Anesthesia Home Care Instructions  Activity: Get plenty of rest for the remainder of the day. A responsible adult should stay with you for 24 hours following the procedure.  For the next 24 hours, DO NOT: -Drive a car -Advertising copywriter -Drink alcoholic beverages -Take any medication unless instructed by your physician -Make any legal decisions or sign important papers.  Meals: Start with liquid foods such as gelatin or soup. Progress to regular foods as tolerated. Avoid greasy, spicy, heavy foods. If nausea and/or vomiting occur, drink only clear liquids until the nausea and/or vomiting subsides. Call your physician if vomiting continues.  Special Instructions/Symptoms: Your throat may feel dry or sore from the anesthesia or the breathing tube placed in your throat during surgery. If this causes discomfort, gargle with warm salt water. The discomfort should disappear within 24 hours.

## 2015-01-10 NOTE — H&P (Signed)
  History and physical exam unchanged 

## 2015-01-10 NOTE — Anesthesia Postprocedure Evaluation (Signed)
  Anesthesia Post-op Note  Patient: Kayla Daugherty  Procedure(s) Performed: Procedure(s): LAPAROSCOPIC SALPINGO OOPHORECTOMY (Bilateral) CYSTOSCOPY  Patient Location: PACU  Anesthesia Type:General  Level of Consciousness: awake and alert   Airway and Oxygen Therapy: Patient Spontanous Breathing  Post-op Pain: mild  Post-op Assessment: Post-op Vital signs reviewed, Patient's Cardiovascular Status Stable and Respiratory Function Stable  Post-op Vital Signs: Reviewed  Filed Vitals:   01/10/15 1000  BP: 122/86  Pulse: 61  Temp:   Resp: 16    Complications: No apparent anesthesia complications

## 2015-01-10 NOTE — Anesthesia Procedure Notes (Signed)
Procedure Name: Intubation Date/Time: 01/10/2015 7:32 AM Performed by: Renella CunasHAZEL, Winton Offord D Pre-anesthesia Checklist: Patient identified, Emergency Drugs available, Suction available and Patient being monitored Patient Re-evaluated:Patient Re-evaluated prior to inductionOxygen Delivery Method: Circle System Utilized Preoxygenation: Pre-oxygenation with 100% oxygen Intubation Type: IV induction Ventilation: Mask ventilation without difficulty Laryngoscope Size: Mac and 3 Grade View: Grade II Tube type: Oral Tube size: 7.0 mm Number of attempts: 1 Airway Equipment and Method: Stylet and Oral airway Placement Confirmation: ETT inserted through vocal cords under direct vision,  positive ETCO2 and breath sounds checked- equal and bilateral Secured at: 21 cm Tube secured with: Tape Dental Injury: Teeth and Oropharynx as per pre-operative assessment

## 2015-01-10 NOTE — Anesthesia Preprocedure Evaluation (Addendum)
Anesthesia Evaluation  Patient identified by MRN, date of birth, ID band Patient awake    Reviewed: Allergy & Precautions, H&P , NPO status , Patient's Chart, lab work & pertinent test results  Airway Mallampati: III  TM Distance: >3 FB Neck ROM: Full    Dental no notable dental hx. (+) Teeth Intact, Dental Advisory Given   Pulmonary sleep apnea and Continuous Positive Airway Pressure Ventilation , former smoker,  breath sounds clear to auscultation  Pulmonary exam normal       Cardiovascular negative cardio ROS  Rhythm:Regular Rate:Normal     Neuro/Psych  Headaches, negative psych ROS   GI/Hepatic negative GI ROS, Neg liver ROS,   Endo/Other  negative endocrine ROS  Renal/GU negative Renal ROS  negative genitourinary   Musculoskeletal   Abdominal   Peds  Hematology negative hematology ROS (+)   Anesthesia Other Findings   Reproductive/Obstetrics negative OB ROS                            Anesthesia Physical Anesthesia Plan  ASA: II  Anesthesia Plan: General   Post-op Pain Management:    Induction: Intravenous  Airway Management Planned: LMA  Additional Equipment:   Intra-op Plan:   Post-operative Plan: Extubation in OR  Informed Consent: I have reviewed the patients History and Physical, chart, labs and discussed the procedure including the risks, benefits and alternatives for the proposed anesthesia with the patient or authorized representative who has indicated his/her understanding and acceptance.   Dental advisory given  Plan Discussed with: CRNA  Anesthesia Plan Comments:         Anesthesia Quick Evaluation

## 2015-01-10 NOTE — Transfer of Care (Signed)
Immediate Anesthesia Transfer of Care Note  Patient: Kayla Daugherty  Procedure(s) Performed: Procedure(s) (LRB): LAPAROSCOPIC SALPINGO OOPHORECTOMY (Bilateral) CYSTOSCOPY  Patient Location: PACU  Anesthesia Type: General  Level of Consciousness: awake, oriented, sedated and patient cooperative  Airway & Oxygen Therapy: Patient Spontanous Breathing and Patient connected to face mask oxygen  Post-op Assessment: Report given to PACU RN and Post -op Vital signs reviewed and stable  Post vital signs: Reviewed and stable  Complications: No apparent anesthesia complications

## 2015-01-11 ENCOUNTER — Encounter (HOSPITAL_BASED_OUTPATIENT_CLINIC_OR_DEPARTMENT_OTHER): Payer: Self-pay | Admitting: Obstetrics and Gynecology

## 2015-08-16 ENCOUNTER — Ambulatory Visit: Payer: BLUE CROSS/BLUE SHIELD | Admitting: Family Medicine

## 2015-08-21 ENCOUNTER — Ambulatory Visit: Payer: BLUE CROSS/BLUE SHIELD | Admitting: Sports Medicine

## 2017-04-04 ENCOUNTER — Other Ambulatory Visit: Payer: Self-pay | Admitting: Family Medicine

## 2017-04-04 ENCOUNTER — Other Ambulatory Visit: Payer: Self-pay | Admitting: Obstetrics and Gynecology

## 2017-04-04 DIAGNOSIS — Z1231 Encounter for screening mammogram for malignant neoplasm of breast: Secondary | ICD-10-CM

## 2017-04-16 ENCOUNTER — Ambulatory Visit
Admission: RE | Admit: 2017-04-16 | Discharge: 2017-04-16 | Disposition: A | Discharge status: Home / Self Care | Payer: BLUE CROSS/BLUE SHIELD | Source: Ambulatory Visit | Attending: Family Medicine | Admitting: Family Medicine

## 2017-04-16 DIAGNOSIS — Z1231 Encounter for screening mammogram for malignant neoplasm of breast: Secondary | ICD-10-CM

## 2017-07-10 ENCOUNTER — Other Ambulatory Visit: Payer: Self-pay | Admitting: Podiatry

## 2017-07-10 ENCOUNTER — Ambulatory Visit (INDEPENDENT_AMBULATORY_CARE_PROVIDER_SITE_OTHER): Payer: BLUE CROSS/BLUE SHIELD

## 2017-07-10 ENCOUNTER — Ambulatory Visit (INDEPENDENT_AMBULATORY_CARE_PROVIDER_SITE_OTHER): Payer: BLUE CROSS/BLUE SHIELD | Admitting: Podiatry

## 2017-07-10 ENCOUNTER — Encounter: Payer: Self-pay | Admitting: Podiatry

## 2017-07-10 VITALS — BP 132/87 | HR 72 | Resp 16

## 2017-07-10 DIAGNOSIS — M779 Enthesopathy, unspecified: Secondary | ICD-10-CM | POA: Diagnosis not present

## 2017-07-10 DIAGNOSIS — M79672 Pain in left foot: Secondary | ICD-10-CM

## 2017-07-10 DIAGNOSIS — M79671 Pain in right foot: Secondary | ICD-10-CM

## 2017-07-10 DIAGNOSIS — M7750 Other enthesopathy of unspecified foot: Secondary | ICD-10-CM

## 2017-07-10 NOTE — Progress Notes (Signed)
Subjective:    Patient ID: Kayla Daugherty, female   DOB: 56 y.o.   MRN: [id number]   HPI patient presents with chronic pain under the metatarsals right over left and states that she works at a job on [employer] education [employer] and that gradually bothering her more and more. States that she has had this problem for several years and is tried over-the-counter insoles without relief    Review of Systems  All other systems reviewed and are negative.       Objective:  Physical Exam  Constitutional: She appears well-developed and well-nourished.  Cardiovascular: Intact distal pulses.   Pulmonary/Chest: Effort normal.  Neurological: She is alert.  Skin: Skin is warm.  Nursing note and vitals reviewed.  neurovascular status intact muscle strength adequate range of motion within normal limits with exquisite discomfort underneath the metatarsal heads right over left with inflammation fluid and moderate diminishment of the fat pad. It is not specific to one metatarsal but appears to be across the entire metatarsophalangeal joint surface right over left     Assessment:   Inflammatory capsulitis with metatarsalgia and plantarflexed metatarsals bilateral      Plan:    H&P conditions reviewed and at this point I recommended a soft type orthotic with thick metatarsal pad and I had patient work with Raiford Noble in order to have these devices created. She will be dispensed these next few weeks  X-rays do indicate there is slight elongation third metatarsal segment bilateral

## 2017-07-10 NOTE — Progress Notes (Signed)
   Subjective:    Patient ID: Kayla Daugherty, female    DOB: Sep 04, 1961, 56 y.o.   MRN: [id number]  HPI    Review of Systems  All other systems reviewed and are negative.      Objective:   Physical Exam        Assessment & Plan:

## 2017-07-31 ENCOUNTER — Encounter: Payer: BLUE CROSS/BLUE SHIELD | Admitting: Orthotics

## 2017-08-12 ENCOUNTER — Ambulatory Visit (INDEPENDENT_AMBULATORY_CARE_PROVIDER_SITE_OTHER): Payer: BLUE CROSS/BLUE SHIELD | Admitting: Orthotics

## 2017-08-12 DIAGNOSIS — M79672 Pain in left foot: Principal | ICD-10-CM

## 2017-08-12 DIAGNOSIS — M79671 Pain in right foot: Secondary | ICD-10-CM

## 2017-08-12 NOTE — Progress Notes (Signed)
Patient came in today to pick up custom made foot orthotics.  The goals were accomplished and the patient reported no dissatisfaction with said orthotics.  Patient was advised of breakin period and how to report any issues. 

## 2017-08-14 ENCOUNTER — Encounter: Payer: BLUE CROSS/BLUE SHIELD | Admitting: Orthotics

## 2018-04-01 ENCOUNTER — Other Ambulatory Visit: Payer: Self-pay

## 2018-04-01 ENCOUNTER — Emergency Department (HOSPITAL_BASED_OUTPATIENT_CLINIC_OR_DEPARTMENT_OTHER): Payer: BLUE CROSS/BLUE SHIELD

## 2018-04-01 ENCOUNTER — Emergency Department (HOSPITAL_BASED_OUTPATIENT_CLINIC_OR_DEPARTMENT_OTHER)
Admission: EM | Admit: 2018-04-01 | Discharge: 2018-04-01 | Disposition: A | Discharge status: Home / Self Care | Payer: BLUE CROSS/BLUE SHIELD | Attending: Emergency Medicine | Admitting: Emergency Medicine

## 2018-04-01 ENCOUNTER — Encounter (HOSPITAL_BASED_OUTPATIENT_CLINIC_OR_DEPARTMENT_OTHER): Payer: Self-pay

## 2018-04-01 DIAGNOSIS — Z79899 Other long term (current) drug therapy: Secondary | ICD-10-CM | POA: Diagnosis not present

## 2018-04-01 DIAGNOSIS — M79604 Pain in right leg: Secondary | ICD-10-CM | POA: Diagnosis present

## 2018-04-01 DIAGNOSIS — Z87891 Personal history of nicotine dependence: Secondary | ICD-10-CM | POA: Insufficient documentation

## 2018-04-01 NOTE — ED Provider Notes (Signed)
[facility] EMERGENCY DEPARTMENT Provider Note   CSN: [id number] Arrival date & time: 04/01/18  1934     History   Chief Complaint Chief Complaint  Patient presents with  . Leg Swelling    HPI Kayla Daugherty is a 57 y.o. female who presents with right knee and calf pain and swelling.  She states that the pain started about a week ago in her right knee.  For her job she is on her feet for 12 hours a day and wears heavy boots.  Today she noticed swelling over her right calf which concerned her.  She is never had these symptoms before.  She took some Aleve with mild relief.  Nothing makes it worse.  She denies history of DVT.  She states she is has a history of arthritis in her right knee but this pain feels a little bit different.  HPI  Past Medical History:  Diagnosis Date  . Family history of ovarian carcinoma   . High risk of ovarian cancer   . Hyperlipidemia   . Migraines   . Mild obstructive sleep apnea    study done Sept 2015--  mild osa per pt in process being fitted for cpap  . Wears glasses   . Yeast dermatitis    abdomin-  comes and goes    Patient Active Problem List   Diagnosis Date Noted  . S/P bilateral salpingo-oophorectomy 01/10/2015    Class: Status post  . S/P cystoscopy 01/10/2015    Class: Status post  . METATARSALGIA 09/12/2010  . FOOT PAIN, RIGHT 09/12/2010    Past Surgical History:  Procedure Laterality Date  . COLONOSCOPY  2013  . CYSTOSCOPY  01/10/2015   Procedure: CYSTOSCOPY;  Surgeon: Richardean Chimera, MD;  Location: [facility];  Service: Gynecology;;  . LAPAROSCOPIC SALPINGO OOPHERECTOMY Bilateral 01/10/2015   Procedure: LAPAROSCOPIC SALPINGO OOPHORECTOMY;  Surgeon: Richardean Chimera, MD;  Location: [facility];  Service: Gynecology;  Laterality: Bilateral;  . TONSILLECTOMY  as child     OB History   None      Home Medications    Prior to Admission medications   Medication Sig Start Date End Date  Taking? Authorizing Provider  clonazePAM (KLONOPIN) 1 MG tablet Take 1 mg by mouth at bedtime.     [provider]  cyclobenzaprine (FLEXERIL) 10 MG tablet Take 10 mg by mouth 3 (three) times daily as needed for muscle spasms.    [provider]  LIVALO 2 MG TABS  06/17/17   [provider]  sertraline (ZOLOFT) 50 MG tablet Take 50 mg by mouth at bedtime.    [provider]  SUMAtriptan (IMITREX) 100 MG tablet Take 100 mg by mouth every 2 (two) hours as needed for migraine. May repeat in 2 hours if headache persists or recurs.    [provider]  topiramate (TOPAMAX) 50 MG tablet Take 50 mg by mouth at bedtime.    [provider]  zolpidem (AMBIEN) 10 MG tablet Take 10 mg by mouth at bedtime as needed for sleep.    [provider]    Family History No family history on file.  Social History Social History   Tobacco Use  . Smoking status: Former Smoker    Years: 15.00    Types: Cigarettes    Last attempt to quit: 01/03/2005    Years since quitting: 13.2  . Smokeless tobacco: Never Used  Substance Use Topics  . Alcohol use: Yes  Comment: social  . Drug use: No     Allergies   Latex and Tetracycline   Review of Systems Review of Systems  Cardiovascular: Positive for leg swelling.  Musculoskeletal: Positive for arthralgias.  Skin: Negative for color change and wound.     Physical Exam Updated Vital Signs BP (!) 142/92 (BP Location: Left Arm)   Pulse 73   Temp 98.1 F (36.7 C) (Oral)   Resp 18   Ht 5\' 6"  (1.676 m)   Wt 85.9 kg (189 lb 6 oz)   LMP 04/26/2014 Comment: irregular periods per pt   SpO2 99%   BMI 30.57 kg/m   Physical Exam  Constitutional: She is oriented to person, place, and time. She appears well-developed and well-nourished. No distress.  HENT:  Head: Normocephalic and atraumatic.  Eyes: Pupils are equal, round, and reactive to light. Conjunctivae are normal. Right eye exhibits no  discharge. Left eye exhibits no discharge. No scleral icterus.  Neck: Normal range of motion.  Cardiovascular: Normal rate.  Pulmonary/Chest: Effort normal. No respiratory distress.  Abdominal: She exhibits no distension.  Musculoskeletal:  Right knee: No obvious swelling, deformity, or warmth. No tenderness. FROM. No obvious calf swelling or color change. 2+ DP pulses bilaterally.  Right calf measures 15.5 inches.  Left calf measures 15.75 inches   Neurological: She is alert and oriented to person, place, and time.  Skin: Skin is warm and dry.  Psychiatric: She has a normal mood and affect. Her behavior is normal.  Nursing note and vitals reviewed.    ED Treatments / Results  Labs (all labs ordered are listed, but only abnormal results are displayed) Labs Reviewed - No data to display  EKG None  Radiology [place] Venous Img Lower Unilateral Right  Result Date: 04/01/2018 CLINICAL DATA:  Right lower extremity swelling for 1 week EXAM: RIGHT LOWER EXTREMITY VENOUS DOPPLER ULTRASOUND TECHNIQUE: Gray-scale sonography with graded compression, as well as color Doppler and duplex ultrasound were performed to evaluate the lower extremity deep venous systems from the level of the common femoral vein and including the common femoral, femoral, profunda femoral, popliteal and calf veins including the posterior tibial, peroneal and gastrocnemius veins when visible. The superficial great saphenous vein was also interrogated. Spectral Doppler was utilized to evaluate flow at rest and with distal augmentation maneuvers in the common femoral, femoral and popliteal veins. COMPARISON:  None. FINDINGS: Contralateral Common Femoral Vein: Respiratory phasicity is normal and symmetric with the symptomatic side. No evidence of thrombus. Normal compressibility. Common Femoral Vein: No evidence of thrombus. Normal compressibility, respiratory phasicity and response to augmentation. Saphenofemoral Junction: No evidence  of thrombus. Normal compressibility and flow on color Doppler imaging. Profunda Femoral Vein: No evidence of thrombus. Normal compressibility and flow on color Doppler imaging. Femoral Vein: No evidence of thrombus. Normal compressibility, respiratory phasicity and response to augmentation. Popliteal Vein: No evidence of thrombus. Normal compressibility, respiratory phasicity and response to augmentation. Calf Veins: No evidence of thrombus. Normal compressibility and flow on color Doppler imaging. Superficial Great Saphenous Vein: No evidence of thrombus. Normal compressibility. Venous Reflux:  None. Other Findings:  None. IMPRESSION: No evidence of deep venous thrombosis in the right lower extremity. Electronically Signed   By: Delbert PhenixJason A Poff M.D.   On: 04/01/2018 22:16    Procedures Procedures (including critical care time)  Medications Ordered in ED Medications - No data to display   Initial Impression / Assessment and Plan / ED Course  I have reviewed the triage vital  signs and the nursing notes.  Pertinent labs & imaging results that were available during my care of the patient were reviewed by me and considered in my medical decision making (see chart for details).  57 year old female with atraumatic right knee and calf pain and swelling.  She is mildly hypertensive but otherwise vital signs are normal.  She has no obvious findings on exam.  She has no warmth, swelling, redness of the knee to suggest infection.  She has no obvious swelling of her calf and her left calf is actually slightly larger than her right.  She states that her symptoms were over the medial right calf.  Will obtain a venous ultrasound to rule out DVT.  DVT study is negative.  Results were discussed with the patient and her partner.  Suspect overuse syndrome.  She is advised to continue conservative management and follow-up with her doctor.  Final Clinical Impressions(s) / ED Diagnoses   Final diagnoses:  Right leg  pain    ED Discharge Orders    None       Bethel Born, PA-C 04/01/18 2336    Melene Plan, DO 04/01/18 2342

## 2018-04-01 NOTE — Discharge Instructions (Signed)
Please continue Aleve and follow up with your doctor

## 2018-04-01 NOTE — ED Triage Notes (Signed)
C/o right leg swelling x today- pain to leg 1-2 weeks-denies injury-NAD-steady gait

## 2018-10-20 DIAGNOSIS — G43719 Chronic migraine without aura, intractable, without status migrainosus: Secondary | ICD-10-CM | POA: Diagnosis not present

## 2018-10-20 DIAGNOSIS — G43019 Migraine without aura, intractable, without status migrainosus: Secondary | ICD-10-CM | POA: Diagnosis not present

## 2018-11-25 DIAGNOSIS — Z0000 Encounter for general adult medical examination without abnormal findings: Secondary | ICD-10-CM | POA: Diagnosis not present

## 2018-11-25 DIAGNOSIS — E782 Mixed hyperlipidemia: Secondary | ICD-10-CM | POA: Diagnosis not present

## 2018-11-25 DIAGNOSIS — E669 Obesity, unspecified: Secondary | ICD-10-CM | POA: Diagnosis not present

## 2018-11-25 DIAGNOSIS — R7303 Prediabetes: Secondary | ICD-10-CM | POA: Diagnosis not present

## 2018-12-03 DIAGNOSIS — H01003 Unspecified blepharitis right eye, unspecified eyelid: Secondary | ICD-10-CM | POA: Diagnosis not present

## 2018-12-03 DIAGNOSIS — H25013 Cortical age-related cataract, bilateral: Secondary | ICD-10-CM | POA: Diagnosis not present

## 2018-12-03 DIAGNOSIS — H2513 Age-related nuclear cataract, bilateral: Secondary | ICD-10-CM | POA: Diagnosis not present

## 2018-12-03 DIAGNOSIS — H04123 Dry eye syndrome of bilateral lacrimal glands: Secondary | ICD-10-CM | POA: Diagnosis not present

## 2018-12-03 DIAGNOSIS — H524 Presbyopia: Secondary | ICD-10-CM | POA: Diagnosis not present

## 2019-04-21 DIAGNOSIS — D485 Neoplasm of uncertain behavior of skin: Secondary | ICD-10-CM | POA: Diagnosis not present

## 2019-04-21 DIAGNOSIS — Z86018 Personal history of other benign neoplasm: Secondary | ICD-10-CM | POA: Diagnosis not present

## 2019-04-21 DIAGNOSIS — L814 Other melanin hyperpigmentation: Secondary | ICD-10-CM | POA: Diagnosis not present

## 2019-04-21 DIAGNOSIS — L570 Actinic keratosis: Secondary | ICD-10-CM | POA: Diagnosis not present

## 2019-04-21 DIAGNOSIS — L821 Other seborrheic keratosis: Secondary | ICD-10-CM | POA: Diagnosis not present

## 2019-04-21 DIAGNOSIS — D225 Melanocytic nevi of trunk: Secondary | ICD-10-CM | POA: Diagnosis not present

## 2019-05-13 DIAGNOSIS — L820 Inflamed seborrheic keratosis: Secondary | ICD-10-CM | POA: Diagnosis not present

## 2019-05-13 DIAGNOSIS — D485 Neoplasm of uncertain behavior of skin: Secondary | ICD-10-CM | POA: Diagnosis not present

## 2019-07-09 DIAGNOSIS — R0981 Nasal congestion: Secondary | ICD-10-CM | POA: Diagnosis not present

## 2019-10-04 DIAGNOSIS — F909 Attention-deficit hyperactivity disorder, unspecified type: Secondary | ICD-10-CM | POA: Diagnosis not present

## 2019-10-04 DIAGNOSIS — F411 Generalized anxiety disorder: Secondary | ICD-10-CM | POA: Diagnosis not present

## 2019-11-21 DIAGNOSIS — Z23 Encounter for immunization: Secondary | ICD-10-CM | POA: Diagnosis not present

## 2019-12-07 DIAGNOSIS — R7303 Prediabetes: Secondary | ICD-10-CM | POA: Diagnosis not present

## 2019-12-07 DIAGNOSIS — E782 Mixed hyperlipidemia: Secondary | ICD-10-CM | POA: Diagnosis not present

## 2019-12-07 DIAGNOSIS — E669 Obesity, unspecified: Secondary | ICD-10-CM | POA: Diagnosis not present

## 2019-12-07 DIAGNOSIS — I10 Essential (primary) hypertension: Secondary | ICD-10-CM | POA: Diagnosis not present

## 2019-12-14 DIAGNOSIS — E782 Mixed hyperlipidemia: Secondary | ICD-10-CM | POA: Diagnosis not present

## 2019-12-14 DIAGNOSIS — R7303 Prediabetes: Secondary | ICD-10-CM | POA: Diagnosis not present

## 2019-12-14 DIAGNOSIS — F909 Attention-deficit hyperactivity disorder, unspecified type: Secondary | ICD-10-CM | POA: Diagnosis not present

## 2019-12-14 DIAGNOSIS — Z0000 Encounter for general adult medical examination without abnormal findings: Secondary | ICD-10-CM | POA: Diagnosis not present

## 2019-12-20 DIAGNOSIS — Z23 Encounter for immunization: Secondary | ICD-10-CM | POA: Diagnosis not present

## 2020-03-07 DIAGNOSIS — H524 Presbyopia: Secondary | ICD-10-CM | POA: Diagnosis not present

## 2020-03-07 DIAGNOSIS — H04123 Dry eye syndrome of bilateral lacrimal glands: Secondary | ICD-10-CM | POA: Diagnosis not present

## 2020-03-07 DIAGNOSIS — H25013 Cortical age-related cataract, bilateral: Secondary | ICD-10-CM | POA: Diagnosis not present

## 2020-03-07 DIAGNOSIS — H2513 Age-related nuclear cataract, bilateral: Secondary | ICD-10-CM | POA: Diagnosis not present

## 2020-06-19 DIAGNOSIS — Z86018 Personal history of other benign neoplasm: Secondary | ICD-10-CM | POA: Diagnosis not present

## 2020-06-19 DIAGNOSIS — D225 Melanocytic nevi of trunk: Secondary | ICD-10-CM | POA: Diagnosis not present

## 2020-06-19 DIAGNOSIS — L821 Other seborrheic keratosis: Secondary | ICD-10-CM | POA: Diagnosis not present

## 2020-06-19 DIAGNOSIS — L814 Other melanin hyperpigmentation: Secondary | ICD-10-CM | POA: Diagnosis not present

## 2020-07-08 DIAGNOSIS — Z1231 Encounter for screening mammogram for malignant neoplasm of breast: Secondary | ICD-10-CM | POA: Diagnosis not present

## 2020-08-16 DIAGNOSIS — F411 Generalized anxiety disorder: Secondary | ICD-10-CM | POA: Diagnosis not present

## 2020-08-16 DIAGNOSIS — I10 Essential (primary) hypertension: Secondary | ICD-10-CM | POA: Diagnosis not present

## 2020-08-16 DIAGNOSIS — E782 Mixed hyperlipidemia: Secondary | ICD-10-CM | POA: Diagnosis not present

## 2020-08-16 DIAGNOSIS — R7303 Prediabetes: Secondary | ICD-10-CM | POA: Diagnosis not present

## 2020-08-16 DIAGNOSIS — F909 Attention-deficit hyperactivity disorder, unspecified type: Secondary | ICD-10-CM | POA: Diagnosis not present

## 2020-10-26 DIAGNOSIS — E782 Mixed hyperlipidemia: Secondary | ICD-10-CM | POA: Diagnosis not present

## 2020-10-26 DIAGNOSIS — Z6832 Body mass index (BMI) 32.0-32.9, adult: Secondary | ICD-10-CM | POA: Diagnosis not present

## 2020-10-26 DIAGNOSIS — Z13228 Encounter for screening for other metabolic disorders: Secondary | ICD-10-CM | POA: Diagnosis not present

## 2020-10-26 DIAGNOSIS — I10 Essential (primary) hypertension: Secondary | ICD-10-CM | POA: Diagnosis not present

## 2020-10-26 DIAGNOSIS — E6609 Other obesity due to excess calories: Secondary | ICD-10-CM | POA: Diagnosis not present

## 2020-10-26 DIAGNOSIS — F325 Major depressive disorder, single episode, in full remission: Secondary | ICD-10-CM | POA: Diagnosis not present

## 2020-11-02 DIAGNOSIS — E669 Obesity, unspecified: Secondary | ICD-10-CM | POA: Diagnosis not present

## 2020-11-02 DIAGNOSIS — Z6830 Body mass index (BMI) 30.0-30.9, adult: Secondary | ICD-10-CM | POA: Diagnosis not present

## 2020-11-02 DIAGNOSIS — Z712 Person consulting for explanation of examination or test findings: Secondary | ICD-10-CM | POA: Diagnosis not present

## 2020-11-02 DIAGNOSIS — Z713 Dietary counseling and surveillance: Secondary | ICD-10-CM | POA: Diagnosis not present

## 2020-11-02 DIAGNOSIS — E6609 Other obesity due to excess calories: Secondary | ICD-10-CM | POA: Diagnosis not present

## 2020-11-02 DIAGNOSIS — Z6832 Body mass index (BMI) 32.0-32.9, adult: Secondary | ICD-10-CM | POA: Diagnosis not present

## 2020-11-02 DIAGNOSIS — Z7689 Persons encountering health services in other specified circumstances: Secondary | ICD-10-CM | POA: Diagnosis not present

## 2020-12-25 DIAGNOSIS — Z0000 Encounter for general adult medical examination without abnormal findings: Secondary | ICD-10-CM | POA: Diagnosis not present

## 2020-12-25 DIAGNOSIS — R7303 Prediabetes: Secondary | ICD-10-CM | POA: Diagnosis not present

## 2020-12-25 DIAGNOSIS — Z124 Encounter for screening for malignant neoplasm of cervix: Secondary | ICD-10-CM | POA: Diagnosis not present

## 2020-12-25 DIAGNOSIS — E782 Mixed hyperlipidemia: Secondary | ICD-10-CM | POA: Diagnosis not present

## 2021-01-08 ENCOUNTER — Ambulatory Visit: Payer: Self-pay

## 2021-01-08 ENCOUNTER — Other Ambulatory Visit: Payer: Self-pay

## 2021-01-08 ENCOUNTER — Ambulatory Visit (INDEPENDENT_AMBULATORY_CARE_PROVIDER_SITE_OTHER): Payer: BC Managed Care – PPO | Admitting: Family Medicine

## 2021-01-08 ENCOUNTER — Encounter: Payer: Self-pay | Admitting: Family Medicine

## 2021-01-08 DIAGNOSIS — M542 Cervicalgia: Secondary | ICD-10-CM | POA: Diagnosis not present

## 2021-01-08 MED ORDER — BACLOFEN 10 MG PO TABS: 5.0000 mg | ORAL_TABLET | Freq: Three times a day (TID) | ORAL | 3 refills | Status: DC | PRN

## 2021-01-08 MED ORDER — CELECOXIB 100 MG PO CAPS: 100.0000 mg | ORAL_CAPSULE | Freq: Two times a day (BID) | ORAL | 3 refills | Status: DC | PRN

## 2021-01-08 NOTE — Progress Notes (Signed)
Office Visit Note   Patient: Kayla Daugherty           Date of Birth: 26-Oct-1960           MRN: [id number] Visit Date: 01/08/2021 Requested by: Arabella Merles, CNM [address] [place],  [place] [address] PCP: Arabella Merles, CNM  Subjective: Chief Complaint  Patient presents with  . Neck - Pain    Chronic neck pain. For the last 6-8 months, the pain has been on the left side of the neck and into the shoulder and upper back muscles. Some of the same pain on the right. No numbness/tingling in the arms/hands. Left-hand dominant. Does have headaches, but she already also has issues with migraines.    HPI: She is here with neck pain.  Symptoms started every 6 to 8 months ago, but intermittently long before that.  She had an MRI scan in 2015 showing spondylosis.  At that time she went to physical therapy which did give her some relief.  She did home exercises for a while but now they do not seem to be helping.  In the last 6 to 8 months her pain has been present on a daily basis, with intermittent pain radiating into the left arm.  She is left-hand dominant.  The pain has kept her from playing golf.  She is taking over-the-counter anti-inflammatories.               ROS:   All other systems were reviewed and are negative.  Objective: Vital Signs: LMP 04/26/2014 Comment: irregular periods per pt   Physical Exam:  General:  Alert and oriented, in no acute distress. Pulm:  Breathing unlabored. Psy:  Normal mood, congruent affect.  Neck: She has limited side bending bilaterally, Spurling's test is equivocal.  Very tender in the left-sided paraspinous muscles near C7, and in the trapezius belly and rhomboid area she has multiple trigger points.  Upper extremity strength and reflexes are normal.    Imaging: XR Cervical Spine 2 or 3 views  Result Date: 01/08/2021 X-rays cervical spine reveal moderate degenerative disc disease at C4-5, C5-6 and C6-7.  There is facet joint arthropathy  diffusely and uncovertebral DJD.  No sign of neoplasm or compression fracture.  There is reversal of the cervical curve.   Assessment & Plan: 1.  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  Try dry needling and cervical traction.  Prescriptions given for baclofen and Celebrex to use as needed.  If she fails to improve, she will contact me and I will order a new MRI scan.     Procedures: No procedures performed        PMFS History: Patient Active Problem List   Diagnosis Date Noted  . S/P bilateral salpingo-oophorectomy 01/10/2015    Class: Status post  . S/P cystoscopy 01/10/2015    Class: Status post  . METATARSALGIA 09/12/2010  . FOOT PAIN, RIGHT 09/12/2010   Past Medical History:  Diagnosis Date  . Family history of ovarian carcinoma   . High risk of ovarian cancer   . Hyperlipidemia   . Migraines   . Mild obstructive sleep apnea    study done Sept 2015--  mild osa per pt in process being fitted for cpap  . Wears glasses   . Yeast dermatitis    abdomin-  comes and goes    History reviewed. No pertinent family history.  Past  Surgical History:  Procedure Laterality Date  . COLONOSCOPY  2013  . CYSTOSCOPY  01/10/2015   Procedure: CYSTOSCOPY;  Surgeon: Richardean Chimera, MD;  Location: [facility];  Service: Gynecology;;  . LAPAROSCOPIC SALPINGO OOPHERECTOMY Bilateral 01/10/2015   Procedure: LAPAROSCOPIC SALPINGO OOPHORECTOMY;  Surgeon: Richardean Chimera, MD;  Location: [facility];  Service: Gynecology;  Laterality: Bilateral;  . TONSILLECTOMY  as child   Social History   Occupational History  . Not on file  Tobacco Use  . Smoking status: Former Smoker    Years: 15.00    Types: Cigarettes    Quit date: 01/03/2005    Years since quitting: 16.0  . Smokeless tobacco: Never Used  Substance and Sexual Activity  . Alcohol use: Yes    Comment: social  . Drug use: No  . Sexual  activity: Not on file

## 2021-01-23 ENCOUNTER — Encounter: Payer: Self-pay | Admitting: Family Medicine

## 2021-01-23 DIAGNOSIS — M542 Cervicalgia: Secondary | ICD-10-CM

## 2021-02-02 DIAGNOSIS — M542 Cervicalgia: Secondary | ICD-10-CM | POA: Diagnosis not present

## 2021-02-09 DIAGNOSIS — M542 Cervicalgia: Secondary | ICD-10-CM | POA: Diagnosis not present

## 2021-02-14 DIAGNOSIS — E785 Hyperlipidemia, unspecified: Secondary | ICD-10-CM | POA: Diagnosis not present

## 2021-02-14 DIAGNOSIS — E538 Deficiency of other specified B group vitamins: Secondary | ICD-10-CM | POA: Diagnosis not present

## 2021-02-14 DIAGNOSIS — R7303 Prediabetes: Secondary | ICD-10-CM | POA: Diagnosis not present

## 2021-02-14 DIAGNOSIS — E559 Vitamin D deficiency, unspecified: Secondary | ICD-10-CM | POA: Diagnosis not present

## 2021-02-14 DIAGNOSIS — M2550 Pain in unspecified joint: Secondary | ICD-10-CM | POA: Diagnosis not present

## 2021-02-14 DIAGNOSIS — Z1329 Encounter for screening for other suspected endocrine disorder: Secondary | ICD-10-CM | POA: Diagnosis not present

## 2021-02-14 DIAGNOSIS — I10 Essential (primary) hypertension: Secondary | ICD-10-CM | POA: Diagnosis not present

## 2021-02-16 DIAGNOSIS — M542 Cervicalgia: Secondary | ICD-10-CM | POA: Diagnosis not present

## 2021-02-17 ENCOUNTER — Other Ambulatory Visit: Payer: Self-pay

## 2021-02-17 ENCOUNTER — Ambulatory Visit (HOSPITAL_BASED_OUTPATIENT_CLINIC_OR_DEPARTMENT_OTHER)
Admission: RE | Admit: 2021-02-17 | Discharge: 2021-02-17 | Disposition: A | Discharge status: Home / Self Care | Payer: BC Managed Care – PPO | Source: Ambulatory Visit | Attending: Family Medicine | Admitting: Family Medicine

## 2021-02-17 DIAGNOSIS — M542 Cervicalgia: Secondary | ICD-10-CM | POA: Insufficient documentation

## 2021-02-19 ENCOUNTER — Telehealth: Payer: Self-pay | Admitting: Family Medicine

## 2021-02-19 MED ORDER — TRAMADOL HCL 50 MG PO TABS: 50.0000 mg | ORAL_TABLET | Freq: Four times a day (QID) | ORAL | 0 refills | Status: DC | PRN

## 2021-02-19 NOTE — Addendum Note (Signed)
Addended by: Lillia Carmel on: 02/19/2021 07:51 AM   Modules accepted: Orders

## 2021-02-19 NOTE — Telephone Encounter (Signed)
MRI shows arthritis at multiple levels.  There is narrowing of the nerve openings at several levels, but no definite nerve impingement.  The left-sided facet joint at the C2-3 level is swollen.  Could consider referral for injection since pain is severe.

## 2021-02-28 ENCOUNTER — Other Ambulatory Visit: Payer: Self-pay | Admitting: Family Medicine

## 2021-02-28 MED ORDER — TRAMADOL HCL 50 MG PO TABS: 50.0000 mg | ORAL_TABLET | Freq: Four times a day (QID) | ORAL | 0 refills | Status: DC | PRN

## 2021-03-02 DIAGNOSIS — M542 Cervicalgia: Secondary | ICD-10-CM | POA: Diagnosis not present

## 2021-03-08 ENCOUNTER — Other Ambulatory Visit: Payer: Self-pay | Admitting: Family Medicine

## 2021-03-08 MED ORDER — BACLOFEN 10 MG PO TABS: 5.0000 mg | ORAL_TABLET | Freq: Three times a day (TID) | ORAL | 3 refills | Status: DC | PRN

## 2021-03-09 DIAGNOSIS — M542 Cervicalgia: Secondary | ICD-10-CM | POA: Diagnosis not present

## 2021-03-15 ENCOUNTER — Other Ambulatory Visit: Payer: Self-pay | Admitting: Family Medicine

## 2021-03-15 MED ORDER — TRAMADOL HCL 50 MG PO TABS: 50.0000 mg | ORAL_TABLET | Freq: Four times a day (QID) | ORAL | 0 refills | Status: DC | PRN

## 2021-03-23 DIAGNOSIS — M542 Cervicalgia: Secondary | ICD-10-CM | POA: Diagnosis not present

## 2021-03-29 DIAGNOSIS — M25562 Pain in left knee: Secondary | ICD-10-CM | POA: Diagnosis not present

## 2021-03-29 DIAGNOSIS — M1990 Unspecified osteoarthritis, unspecified site: Secondary | ICD-10-CM | POA: Diagnosis not present

## 2021-03-29 DIAGNOSIS — M25561 Pain in right knee: Secondary | ICD-10-CM | POA: Diagnosis not present

## 2021-04-03 DIAGNOSIS — J019 Acute sinusitis, unspecified: Secondary | ICD-10-CM | POA: Diagnosis not present

## 2021-04-13 ENCOUNTER — Other Ambulatory Visit: Payer: Self-pay | Admitting: Family Medicine

## 2021-04-13 DIAGNOSIS — M542 Cervicalgia: Secondary | ICD-10-CM

## 2021-04-16 MED ORDER — CELECOXIB 100 MG PO CAPS: 100.0000 mg | ORAL_CAPSULE | Freq: Two times a day (BID) | ORAL | 3 refills | Status: DC | PRN

## 2021-04-16 MED ORDER — TRAMADOL HCL 50 MG PO TABS: 50.0000 mg | ORAL_TABLET | Freq: Four times a day (QID) | ORAL | 0 refills | Status: DC | PRN

## 2021-04-16 MED ORDER — BACLOFEN 10 MG PO TABS: 5.0000 mg | ORAL_TABLET | Freq: Three times a day (TID) | ORAL | 3 refills | Status: DC | PRN

## 2021-04-16 NOTE — Telephone Encounter (Signed)
Sent a MyChart message to the patient with the message from Dr. Prince Rome.

## 2021-04-16 NOTE — Addendum Note (Signed)
Addended by: Lillia Carmel on: 04/16/2021 10:14 AM   Modules accepted: Orders

## 2021-04-19 DIAGNOSIS — M2550 Pain in unspecified joint: Secondary | ICD-10-CM | POA: Diagnosis not present

## 2021-04-19 DIAGNOSIS — E785 Hyperlipidemia, unspecified: Secondary | ICD-10-CM | POA: Diagnosis not present

## 2021-04-19 DIAGNOSIS — R7303 Prediabetes: Secondary | ICD-10-CM | POA: Diagnosis not present

## 2021-04-19 DIAGNOSIS — I10 Essential (primary) hypertension: Secondary | ICD-10-CM | POA: Diagnosis not present

## 2021-04-23 ENCOUNTER — Encounter: Payer: Self-pay | Admitting: Family Medicine

## 2021-04-23 ENCOUNTER — Other Ambulatory Visit: Payer: Self-pay | Admitting: Family Medicine

## 2021-04-23 MED ORDER — TRAMADOL HCL 50 MG PO TABS: 50.0000 mg | ORAL_TABLET | Freq: Two times a day (BID) | ORAL | 0 refills | Status: DC | PRN

## 2021-04-24 ENCOUNTER — Encounter: Payer: Self-pay | Admitting: Physical Medicine & Rehabilitation

## 2021-06-12 ENCOUNTER — Other Ambulatory Visit: Payer: Self-pay | Admitting: Radiology

## 2021-06-12 MED ORDER — BACLOFEN 10 MG PO TABS: 5.0000 mg | ORAL_TABLET | Freq: Three times a day (TID) | ORAL | 3 refills | Status: DC | PRN

## 2021-06-12 NOTE — Telephone Encounter (Signed)
Received fax request from [facility]-  Baclofen 10mg  tablet Take 0.5-1 tablet tid prn spasms #30. Please advise.

## 2021-06-13 NOTE — Progress Notes (Signed)
[facility] [address] [place] [address] Phone: [phone] Subjective:   Bruce Donath, am serving as a scribe for Dr. Antoine Primas. This visit occurred during the SARS-CoV-2 public health emergency.  Safety protocols were in place, including screening questions prior to the visit, additional usage of staff PPE, and extensive cleaning of exam room while observing appropriate contact time as indicated for disinfecting solutions.   I'm seeing this patient by the request  of:  Lupita Raider, MD  CC: Neck pain  [id number]  Kayla Daugherty is a 60 y.o. female coming in with complaint of neck pain. She states that the neck pain has been chronic since the early 1990s. She has tried multiple therapies with little to no relief. The pain in her neck on the left side sometimes goes into the scapular area, the pain on the right side radiates more into the shoulder. Pain varies from being dull to sharp depending on the activity and is worse at night and in the morning. Patient was having pain and was seen another provider where patient was getting tramadol on a regular basis.   MRI IMPRESSION: 02/17/2021 1. Slight progression of multilevel degenerative changes of the cervical spine, as described above. 2. Mild canal stenosis at C4-5. 3. Mild-to-moderate multilevel bilateral foraminal stenosis most pronounced on the left at C5-6. 4. Advanced left facet arthropathy at C2-3 with associated reactive subchondral marrow signal changes and mild adjacent soft tissue edema. This may be a focal source of pain.       Past Medical History:  Diagnosis Date   Family history of ovarian carcinoma    High risk of ovarian cancer    Hyperlipidemia    Migraines    Mild obstructive sleep apnea    study done Sept 2015--  mild osa per pt in process being fitted for cpap   Wears glasses    Yeast dermatitis    abdomin-  comes and goes   Past Surgical History:   Procedure Laterality Date   COLONOSCOPY  2013   CYSTOSCOPY  01/10/2015   Procedure: CYSTOSCOPY;  Surgeon: Richardean Chimera, MD;  Location: [facility] [place];  Service: Gynecology;;   LAPAROSCOPIC SALPINGO OOPHERECTOMY Bilateral 01/10/2015   Procedure: LAPAROSCOPIC SALPINGO OOPHORECTOMY;  Surgeon: Richardean Chimera, MD;  Location: [facility];  Service: Gynecology;  Laterality: Bilateral;   TONSILLECTOMY  as child   Social History   Socioeconomic History   Marital status: Married    Spouse name: Not on file   Number of children: Not on file   Years of education: Not on file   Highest education level: Not on file  Occupational History   Not on file  Tobacco Use   Smoking status: Former    Years: 15.00    Types: Cigarettes    Quit date: 01/03/2005    Years since quitting: 16.4   Smokeless tobacco: Never  Substance and Sexual Activity   Alcohol use: Yes    Comment: social   Drug use: No   Sexual activity: Not on file  Other Topics Concern   Not on file  Social History Narrative   Not on file   Social Determinants of Health   Financial Resource Strain: Not on file  Food Insecurity: Not on file  Transportation Needs: Not on file  Physical Activity: Not on file  Stress: Not on file  Social Connections: Not on file   Allergies  Allergen Reactions   Bee  Venom     Other reaction(s): swelling   Latex Other (See Comments)    "reddness"   Simvastatin     Other reaction(s): joint pain   Tetracycline Nausea And Vomiting   No family history on file.   Current Outpatient Medications (Cardiovascular):    LIVALO 2 MG TABS,    Current Outpatient Medications (Analgesics):    celecoxib (CELEBREX) 100 MG capsule, Take 1 capsule (100 mg total) by mouth 2 (two) times daily as needed.   SUMAtriptan (IMITREX) 100 MG tablet, Take 100 mg by mouth every 2 (two) hours as needed for migraine. May repeat in 2 hours if headache persists or recurs.   traMADol (ULTRAM) 50 MG  tablet, Take 1 tablet (50 mg total) by mouth 2 (two) times daily as needed.   Current Outpatient Medications (Other):    gabapentin (NEURONTIN) 100 MG capsule, Take 2 capsules (200 mg total) by mouth at bedtime.   baclofen (LIORESAL) 10 MG tablet, Take 0.5-1 tablets (5-10 mg total) by mouth 3 (three) times daily as needed for muscle spasms.   clonazePAM (KLONOPIN) 1 MG tablet, Take 1 mg by mouth at bedtime.    sertraline (ZOLOFT) 50 MG tablet, Take 50 mg by mouth at bedtime.   topiramate (TOPAMAX) 50 MG tablet, Take 50 mg by mouth at bedtime.   zolpidem (AMBIEN) 10 MG tablet, Take 10 mg by mouth at bedtime as needed for sleep.   Reviewed prior external information including notes and imaging from  primary care provider As well as notes that were available from care everywhere and other healthcare systems.  Past medical history, social, surgical and family history all reviewed in electronic medical record.  No pertanent information unless stated regarding to the chief complaint.   Review of Systems:  No headache, visual changes, nausea, vomiting, diarrhea, constipation, dizziness, abdominal pain, skin rash, fevers, chills, night sweats, weight loss, swollen lymph nodes, body aches, joint swelling, chest pain, shortness of breath, mood changes. POSITIVE muscle aches  Objective  Blood pressure 128/84, pulse 70, height 5\' 6"  (1.676 m), weight 193 lb (87.5 kg), last menstrual period 04/26/2014, SpO2 96 %.   General: No apparent distress alert and oriented x3 mood and affect normal, dressed appropriately.  HEENT: Pupils equal, extraocular movements intact  Respiratory: Patient's speak in full sentences and does not appear short of breath  Cardiovascular: No lower extremity edema, non tender, no erythema  Gait normal with good balance and coordination.  MSK:  Non tender with full range of motion and good stability and symmetric strength and tone of shoulders, elbows, wrist, hip, knee and ankles  bilaterally.   Neck exam does show some mild loss of lordosis.  Tenderness noted on the left side of the paraspinal musculature of the neck.  Patient does have lacking the last 10 degrees of extension in the last 5 degrees of flexion.  Limited sidebending and rotation to the left noted.  Negative Spurling's noted today.  Tightness noted in the parascapular region and the levator scapula bilaterally.  5 out of 5 strength of the upper extremities.  Osteopathic findings T4 extended rotated and side bent right T6 extended rotated and side bent left T9 extended rotated and side bent right    Impression and Recommendations:     The above documentation has been reviewed and is accurate and complete 04/28/2014, DO

## 2021-06-15 ENCOUNTER — Other Ambulatory Visit: Payer: Self-pay

## 2021-06-15 ENCOUNTER — Ambulatory Visit: Payer: BC Managed Care – PPO | Admitting: Family Medicine

## 2021-06-15 DIAGNOSIS — M5030 Other cervical disc degeneration, unspecified cervical region: Secondary | ICD-10-CM | POA: Diagnosis not present

## 2021-06-15 DIAGNOSIS — M9902 Segmental and somatic dysfunction of thoracic region: Secondary | ICD-10-CM

## 2021-06-15 MED ORDER — GABAPENTIN 100 MG PO CAPS: 200.0000 mg | ORAL_CAPSULE | Freq: Every day | ORAL | 0 refills | Status: DC

## 2021-06-15 NOTE — Patient Instructions (Addendum)
Gabapentin 200mg  Do prescribed exercises at least 3x a week Keeps hands in peripheral vision MyChart or call if you want an epidural See you again in 5 to 6 weeks

## 2021-06-16 ENCOUNTER — Encounter: Payer: Self-pay | Admitting: Family Medicine

## 2021-06-16 DIAGNOSIS — M9902 Segmental and somatic dysfunction of thoracic region: Secondary | ICD-10-CM | POA: Insufficient documentation

## 2021-06-16 DIAGNOSIS — M5030 Other cervical disc degeneration, unspecified cervical region: Secondary | ICD-10-CM | POA: Insufficient documentation

## 2021-06-16 NOTE — Assessment & Plan Note (Signed)
   Decision today to treat with OMT was based on Physical Exam  After verbal consent patient was treated with HVLA, ME, FPR techniques in thoracic,  areas, all areas are chronic   Patient tolerated the procedure well with improvement in symptoms  Patient given exercises, stretches and lifestyle modifications  See medications in patient instructions if given  Patient will follow up in 4-8 weeks

## 2021-06-16 NOTE — Assessment & Plan Note (Signed)
Degenerative disc disease of the cervical spine noted.  Discussed with patient at great length about icing regimen, home exercises, patient has had muscle relaxers previously.  Patient states that the only thing she has responded well to voice tramadol but has not tried gabapentin previously.  Discussed potential side effects but I do think will be beneficial and start on 200 mg at night.  We discussed patient's MRI in greater detail today with her and we discussed the arthritic changes at the C2-C3 would not be potentially helpful but we did discuss that advanced arthritic changes also noted at the C5-C6 level that would likely potentially respond well to an epidural if necessary.  Patient would like to start with the conservative therapy including home exercises.  We did do some mild manipulation of the thoracic spine but avoided the neck.  We discussed other home modalities that could be beneficial.  Patient will follow up with me again 6 weeks

## 2021-06-18 ENCOUNTER — Other Ambulatory Visit: Payer: Self-pay

## 2021-06-18 DIAGNOSIS — M5412 Radiculopathy, cervical region: Secondary | ICD-10-CM

## 2021-06-25 ENCOUNTER — Inpatient Hospital Stay: Admission: RE | Admit: 2021-06-25 | Payer: BC Managed Care – PPO | Source: Ambulatory Visit

## 2021-06-25 DIAGNOSIS — L814 Other melanin hyperpigmentation: Secondary | ICD-10-CM | POA: Diagnosis not present

## 2021-06-25 DIAGNOSIS — Z86018 Personal history of other benign neoplasm: Secondary | ICD-10-CM | POA: Diagnosis not present

## 2021-06-25 DIAGNOSIS — L821 Other seborrheic keratosis: Secondary | ICD-10-CM | POA: Diagnosis not present

## 2021-06-25 DIAGNOSIS — L820 Inflamed seborrheic keratosis: Secondary | ICD-10-CM | POA: Diagnosis not present

## 2021-06-25 DIAGNOSIS — D225 Melanocytic nevi of trunk: Secondary | ICD-10-CM | POA: Diagnosis not present

## 2021-06-26 ENCOUNTER — Ambulatory Visit: Payer: BC Managed Care – PPO | Admitting: Physical Medicine & Rehabilitation

## 2021-07-02 ENCOUNTER — Ambulatory Visit
Admission: RE | Admit: 2021-07-02 | Discharge: 2021-07-02 | Disposition: A | Discharge status: Home / Self Care | Payer: BC Managed Care – PPO | Source: Ambulatory Visit | Attending: Family Medicine | Admitting: Family Medicine

## 2021-07-02 ENCOUNTER — Other Ambulatory Visit: Payer: Self-pay

## 2021-07-02 DIAGNOSIS — M5412 Radiculopathy, cervical region: Secondary | ICD-10-CM

## 2021-07-02 DIAGNOSIS — M50221 Other cervical disc displacement at C4-C5 level: Secondary | ICD-10-CM | POA: Diagnosis not present

## 2021-07-02 DIAGNOSIS — M5021 Other cervical disc displacement,  high cervical region: Secondary | ICD-10-CM | POA: Diagnosis not present

## 2021-07-02 DIAGNOSIS — M50222 Other cervical disc displacement at C5-C6 level: Secondary | ICD-10-CM | POA: Diagnosis not present

## 2021-07-02 MED ORDER — TRIAMCINOLONE ACETONIDE 40 MG/ML IJ SUSP (RADIOLOGY)
60.0000 mg | Freq: Once | INTRAMUSCULAR | Status: AC
  Administered 2021-07-02: 60 mg via EPIDURAL

## 2021-07-02 MED ORDER — IOPAMIDOL (ISOVUE-M 300) INJECTION 61%
1.0000 mL | Freq: Once | INTRAMUSCULAR | Status: AC | PRN
  Administered 2021-07-02: 1 mL via EPIDURAL

## 2021-07-02 NOTE — Discharge Instructions (Signed)

## 2021-07-05 ENCOUNTER — Encounter: Payer: BC Managed Care – PPO | Admitting: Physical Medicine & Rehabilitation

## 2021-07-05 DIAGNOSIS — M2550 Pain in unspecified joint: Secondary | ICD-10-CM | POA: Diagnosis not present

## 2021-07-05 DIAGNOSIS — I10 Essential (primary) hypertension: Secondary | ICD-10-CM | POA: Diagnosis not present

## 2021-07-05 DIAGNOSIS — E039 Hypothyroidism, unspecified: Secondary | ICD-10-CM | POA: Diagnosis not present

## 2021-07-05 DIAGNOSIS — R7303 Prediabetes: Secondary | ICD-10-CM | POA: Diagnosis not present

## 2021-07-05 DIAGNOSIS — E785 Hyperlipidemia, unspecified: Secondary | ICD-10-CM | POA: Diagnosis not present

## 2021-07-05 DIAGNOSIS — E559 Vitamin D deficiency, unspecified: Secondary | ICD-10-CM | POA: Diagnosis not present

## 2021-07-05 DIAGNOSIS — E538 Deficiency of other specified B group vitamins: Secondary | ICD-10-CM | POA: Diagnosis not present

## 2021-07-15 ENCOUNTER — Other Ambulatory Visit: Payer: Self-pay

## 2021-07-15 ENCOUNTER — Emergency Department (HOSPITAL_BASED_OUTPATIENT_CLINIC_OR_DEPARTMENT_OTHER)
Admission: EM | Admit: 2021-07-15 | Discharge: 2021-07-15 | Disposition: A | Discharge status: Home / Self Care | Payer: BC Managed Care – PPO | Attending: Emergency Medicine | Admitting: Emergency Medicine

## 2021-07-15 ENCOUNTER — Encounter (HOSPITAL_BASED_OUTPATIENT_CLINIC_OR_DEPARTMENT_OTHER): Payer: Self-pay | Admitting: Emergency Medicine

## 2021-07-15 DIAGNOSIS — Z91040 Latex allergy status: Secondary | ICD-10-CM | POA: Diagnosis not present

## 2021-07-15 DIAGNOSIS — Z87891 Personal history of nicotine dependence: Secondary | ICD-10-CM | POA: Insufficient documentation

## 2021-07-15 DIAGNOSIS — R319 Hematuria, unspecified: Secondary | ICD-10-CM | POA: Diagnosis not present

## 2021-07-15 LAB — URINALYSIS, ROUTINE W REFLEX MICROSCOPIC
Bilirubin Urine: NEGATIVE
Glucose, UA: NEGATIVE mg/dL
Ketones, ur: NEGATIVE mg/dL
Leukocytes,Ua: NEGATIVE
Nitrite: NEGATIVE
Protein, ur: NEGATIVE mg/dL
Specific Gravity, Urine: 1.01 (ref 1.005–1.030)
pH: 5.5 (ref 5.0–8.0)

## 2021-07-15 LAB — URINALYSIS, MICROSCOPIC (REFLEX)

## 2021-07-15 NOTE — ED Triage Notes (Signed)
Pt c/o hematuria; she notices blood on paper after wiping; she does not think this is vaginal bleeding; c/o some irritation when she voids

## 2021-07-15 NOTE — ED Provider Notes (Signed)
[facility] EMERGENCY DEPARTMENT Provider Note   CSN: [id number] Arrival date & time: 07/15/21  1709     History Chief Complaint  Patient presents with   Hematuria    Kayla Daugherty is a 60 y.o. female.  The history is provided by the patient.  Hematuria This is a new problem. The current episode started 2 days ago. The problem occurs constantly. The problem has not changed since onset.Pertinent negatives include no abdominal pain. Nothing aggravates the symptoms. Nothing relieves the symptoms. She has tried nothing for the symptoms. The treatment provided no relief.      Past Medical History:  Diagnosis Date   Family history of ovarian carcinoma    High risk of ovarian cancer    Hyperlipidemia    Migraines    Mild obstructive sleep apnea    study done Sept 2015--  mild osa per pt in process being fitted for cpap   Wears glasses    Yeast dermatitis    abdomin-  comes and goes    Patient Active Problem List   Diagnosis Date Noted   Degenerative disc disease, cervical 06/16/2021   Somatic dysfunction of spine, thoracic 06/16/2021   S/P bilateral salpingo-oophorectomy 01/10/2015    Class: Status post   S/P cystoscopy 01/10/2015    Class: Status post   METATARSALGIA 09/12/2010   FOOT PAIN, RIGHT 09/12/2010    Past Surgical History:  Procedure Laterality Date   COLONOSCOPY  2013   CYSTOSCOPY  01/10/2015   Procedure: CYSTOSCOPY;  Surgeon: Richardean Chimera, MD;  Location: [facility];  Service: Gynecology;;   LAPAROSCOPIC SALPINGO OOPHERECTOMY Bilateral 01/10/2015   Procedure: LAPAROSCOPIC SALPINGO OOPHORECTOMY;  Surgeon: Richardean Chimera, MD;  Location: [facility] [place];  Service: Gynecology;  Laterality: Bilateral;   TONSILLECTOMY  as child     OB History   No obstetric history on file.     No family history on file.  Social History   Tobacco Use   Smoking status: Former    Years: 15.00    Types: Cigarettes    Quit date:  01/03/2005    Years since quitting: 16.5   Smokeless tobacco: Never  Substance Use Topics   Alcohol use: Yes    Comment: social   Drug use: No    Home Medications Prior to Admission medications   Medication Sig Start Date End Date Taking? Authorizing Provider  baclofen (LIORESAL) 10 MG tablet Take 0.5-1 tablets (5-10 mg total) by mouth 3 (three) times daily as needed for muscle spasms. 06/12/21   Cristie Hem, PA-C  celecoxib (CELEBREX) 100 MG capsule Take 1 capsule (100 mg total) by mouth 2 (two) times daily as needed. 04/16/21   Hilts, Casimiro Needle, MD  clonazePAM (KLONOPIN) 1 MG tablet Take 1 mg by mouth at bedtime.     [provider]  gabapentin (NEURONTIN) 100 MG capsule Take 2 capsules (200 mg total) by mouth at bedtime. 06/15/21   Judi Saa, DO  LIVALO 2 MG TABS  06/17/17   [provider]  sertraline (ZOLOFT) 50 MG tablet Take 50 mg by mouth at bedtime.    [provider]  SUMAtriptan (IMITREX) 100 MG tablet Take 100 mg by mouth every 2 (two) hours as needed for migraine. May repeat in 2 hours if headache persists or recurs.    [provider]  topiramate (TOPAMAX) 50 MG tablet Take 50 mg by mouth at bedtime.    [provider]  traMADol Janean Sark)  50 MG tablet Take 1 tablet (50 mg total) by mouth 2 (two) times daily as needed. 04/23/21   Hilts, Casimiro Needle, MD  zolpidem (AMBIEN) 10 MG tablet Take 10 mg by mouth at bedtime as needed for sleep.    [provider]    Allergies    Bee venom, Latex, Simvastatin, and Tetracycline  Review of Systems   Review of Systems  Constitutional:  Negative for fever.  Gastrointestinal:  Negative for abdominal distention and abdominal pain.  Genitourinary:  Positive for hematuria. Negative for decreased urine volume, difficulty urinating, dyspareunia, dysuria, flank pain, frequency, menstrual problem, pelvic pain, vaginal bleeding, vaginal discharge and vaginal pain.  Neurological:  Negative for  weakness.   Physical Exam Updated Vital Signs BP 121/86   Pulse 79   Temp 98.3 F (36.8 C) (Oral)   Resp 18   Ht 5\' 6"  (1.676 m)   Wt 83.9 kg   LMP 04/26/2014 Comment: irregular periods per pt   SpO2 98%   BMI 29.86 kg/m   Physical Exam Constitutional:      General: She is not in acute distress.    Appearance: She is not ill-appearing.  HENT:     Mouth/Throat:     Mouth: Mucous membranes are moist.  Eyes:     Pupils: Pupils are equal, round, and reactive to light.  Cardiovascular:     Pulses: Normal pulses.  Pulmonary:     Effort: Pulmonary effort is normal.  Abdominal:     General: Abdomen is flat. There is no distension.     Tenderness: There is no abdominal tenderness. There is no guarding or rebound.     Hernia: No hernia is present.  Skin:    Capillary Refill: Capillary refill takes less than 2 seconds.  Neurological:     General: No focal deficit present.     Mental Status: She is alert.  Psychiatric:        Mood and Affect: Mood normal.    ED Results / Procedures / Treatments   Labs (all labs ordered are listed, but only abnormal results are displayed) Labs Reviewed  URINALYSIS, ROUTINE W REFLEX MICROSCOPIC - Abnormal; Notable for the following components:      Result Value   Hgb urine dipstick SMALL (*)    All other components within normal limits  URINALYSIS, MICROSCOPIC (REFLEX) - Abnormal; Notable for the following components:   Bacteria, UA RARE (*)    All other components within normal limits  URINE CULTURE    EKG None  Radiology No results found.  Procedures Procedures   Medications Ordered in ED Medications - No data to display  ED Course  I have reviewed the triage vital signs and the nursing notes.  Pertinent labs & imaging results that were available during my care of the patient were reviewed by me and considered in my medical decision making (see chart for details).    MDM Rules/Calculators/A&P                            ERSA DELANEY is here with painless hematuria.  Normal vitals.  No fever.  Urinalysis negative for infection.  Does appear to maybe be some small red blood cells.  Suspect may be some mild dehydration/irritation.  However is a former smoker.  Recommend follow-up with primary care doctor to reevaluate.  Will refer to urology as she is a former smoker.  Suspect that this is likely not  true hematuria but want to make sure that patient does not need cystoscopy or other work-up.  She is not having any abdominal pain or fever or chills.  No need for any imaging at this time.  Recommend aggressive hydration with fluids.  She denied any vaginal bleeding or discharge.  She understands return precautions and was discharged from the ED in good condition.  This chart was dictated using voice recognition software.  Despite best efforts to proofread,  errors can occur which can change the documentation meaning.   Final Clinical Impression(s) / ED Diagnoses Final diagnoses:  Hematuria, unspecified type    Rx / DC Orders ED Discharge Orders     None        Virgina Norfolk, DO 07/15/21 1848

## 2021-07-15 NOTE — ED Notes (Signed)
Spoke with Carol in lab to add on urine culture 

## 2021-07-15 NOTE — Discharge Instructions (Addendum)
Overall no obvious signs of infection of urine.  Urine culture has been sent.  If there is infection we will send you an antibiotic and call you.  Please follow-up with urology.  If you develop any pain or other change in symptoms as discussed please return for evaluation.  Follow-up with your primary care doctor as well.

## 2021-07-16 DIAGNOSIS — N950 Postmenopausal bleeding: Secondary | ICD-10-CM | POA: Diagnosis not present

## 2021-07-16 DIAGNOSIS — D72829 Elevated white blood cell count, unspecified: Secondary | ICD-10-CM | POA: Diagnosis not present

## 2021-07-17 LAB — URINE CULTURE: Culture: NO GROWTH

## 2021-07-19 DIAGNOSIS — N950 Postmenopausal bleeding: Secondary | ICD-10-CM | POA: Diagnosis not present

## 2021-07-19 DIAGNOSIS — N8189 Other female genital prolapse: Secondary | ICD-10-CM | POA: Diagnosis not present

## 2021-07-23 DIAGNOSIS — E538 Deficiency of other specified B group vitamins: Secondary | ICD-10-CM | POA: Diagnosis not present

## 2021-07-23 DIAGNOSIS — E785 Hyperlipidemia, unspecified: Secondary | ICD-10-CM | POA: Diagnosis not present

## 2021-07-23 DIAGNOSIS — I10 Essential (primary) hypertension: Secondary | ICD-10-CM | POA: Diagnosis not present

## 2021-07-23 DIAGNOSIS — R7303 Prediabetes: Secondary | ICD-10-CM | POA: Diagnosis not present

## 2021-07-25 ENCOUNTER — Other Ambulatory Visit: Payer: Self-pay

## 2021-07-25 ENCOUNTER — Ambulatory Visit: Payer: BC Managed Care – PPO | Admitting: Family Medicine

## 2021-07-25 VITALS — BP 124/74 | HR 72 | Ht 66.0 in | Wt 196.0 lb

## 2021-07-25 DIAGNOSIS — M5030 Other cervical disc degeneration, unspecified cervical region: Secondary | ICD-10-CM

## 2021-07-25 DIAGNOSIS — M9902 Segmental and somatic dysfunction of thoracic region: Secondary | ICD-10-CM

## 2021-07-25 MED ORDER — TRAMADOL HCL 50 MG PO TABS: 50.0000 mg | ORAL_TABLET | Freq: Three times a day (TID) | ORAL | 0 refills | Status: AC | PRN

## 2021-07-25 NOTE — Progress Notes (Signed)
[facility] [address] [place] [address] Phone: [phone] Subjective:   Kayla Daugherty, am serving as a scribe for Dr. Antoine Primas. This visit occurred during the SARS-CoV-2 public health emergency.  Safety protocols were in place, including screening questions prior to the visit, additional usage of staff PPE, and extensive cleaning of exam room while observing appropriate contact time as indicated for disinfecting solutions.   I'm seeing this patient by the request  of:  Lupita Raider, MD  CC: back and neck pain f/u   [id number]  Kayla Daugherty is a 60 y.o. female coming in with complaint of back and neck pain. OMT on 06/15/2021. Also has degenerative disc disease. Patient states doing better since epidural on 07/02/2021. Want to know when she can get another and also wants to reorder pain medication  Medications patient has been prescribed: Gabapentin         Reviewed prior external information including notes and imaging from previsou exam, outside providers and external EMR if available.   As well as notes that were available from care everywhere and other healthcare systems.  Past medical history, social, surgical and family history all reviewed in electronic medical record.  No pertanent information unless stated regarding to the chief complaint.   Past Medical History:  Diagnosis Date   Family history of ovarian carcinoma    High risk of ovarian cancer    Hyperlipidemia    Migraines    Mild obstructive sleep apnea    study done Sept 2015--  mild osa per pt in process being fitted for cpap   Wears glasses    Yeast dermatitis    abdomin-  comes and goes    Allergies  Allergen Reactions   Bee Venom     Other reaction(s): swelling   Latex Other (See Comments)    "reddness"   Simvastatin     Other reaction(s): joint pain   Tetracycline Nausea And Vomiting     Review of Systems:  No headache, visual changes,  nausea, vomiting, diarrhea, constipation, dizziness, abdominal pain, skin rash, fevers, chills, night sweats, weight loss, swollen lymph nodes, body aches, joint swelling, chest pain, shortness of breath, mood changes. POSITIVE muscle aches  Objective  Blood pressure 124/74, pulse 72, height 5\' 6"  (1.676 m), weight 196 lb (88.9 kg), last menstrual period 04/26/2014, SpO2 97 %.   General: No apparent distress alert and oriented x3 mood and affect normal, dressed appropriately.  HEENT: Pupils equal, extraocular movements intact  Respiratory: Patient's speak in full sentences and does not appear short of breath  Cardiovascular: No lower extremity edema, non tender, no erythema  Neck exam seems to be much more comfortable than previous exam.  Patient does not during the time.  Patient is moving arms with no significant difficulty.       Assessment and Plan: Degenerative disc disease, cervical Patient responded very well to the epidural and is feeling approximately 65% better.  At this point I do feel the patient could make significant improvements with maybe another 1 at this point.  Patient will have this ordered.  Patient did bring up the tramadol which patient was getting from another provider previously.  Discussed with her I would like her to try to transition off of this medication.  We will give her 15 tablets to have on hand but to use them as needed and we discussed using Tylenol when she does take them.  Discussed which activities to  do which wants to avoid.  Increase activity slowly.  Follow-up again in 6 to 8 weeks.       The above documentation has been reviewed and is accurate and complete Judi Saa, DO         Note: This dictation was prepared with Dragon dictation along with smaller phrase technology. Any transcriptional errors that result from this process are unintentional.

## 2021-07-25 NOTE — Patient Instructions (Addendum)
Continue Gabapentin Keep doing exercises See you again in 2 months

## 2021-07-25 NOTE — Assessment & Plan Note (Signed)
Patient responded very well to the epidural and is feeling approximately 65% better.  At this point I do feel the patient could make significant improvements with maybe another 1 at this point.  Patient will have this ordered.  Patient did bring up the tramadol which patient was getting from another provider previously.  Discussed with her I would like her to try to transition off of this medication.  We will give her 15 tablets to have on hand but to use them as needed and we discussed using Tylenol when she does take them.  Discussed which activities to do which wants to avoid.  Increase activity slowly.  Follow-up again in 6 to 8 weeks.

## 2021-07-31 ENCOUNTER — Encounter: Payer: Self-pay | Admitting: Family Medicine

## 2021-08-01 NOTE — Progress Notes (Signed)
I, Christoper Fabian, LAT, ATC, am serving as scribe for Dr. Clementeen Graham.  Kayla Daugherty is a 60 y.o. female who presents to [facility] at [facility] today for f/u of neck and R shoulder pain.  She was last seen by Dr. Katrinka Blazing on 07/25/21 for OMT and was curious as to when she could get another cervical ESI.  A second cervical ESI was ordered at her last visit but she has not had it yet.  Her 1st cervical ESI (L C6-7) was performed on 07/02/21.  She was also advised to con't taking Gabapentin and to wean off of Tramadol.  Today, pt reports con't R superior shoulder pain w/ radiating pain down the ant and post aspect of her R arm to her R wrist.  Pulling-type motions are the most aggravating and pain is also worse at night.    Diagnostic testing: C-spine MRI- 02/17/21; C-spine XR- 01/08/21  Pertinent review of systems: No fevers or chills  Relevant historical information: Cervical radiculopathy and cervical spine DDD.   Exam:  BP 130/86 (BP Location: Left Arm, Patient Position: Sitting, Cuff Size: Normal)   Pulse 78   Ht 5\' 6"  (1.676 m)   Wt 197 lb 9.6 oz (89.6 kg)   LMP 04/26/2014 Comment: irregular periods per pt   SpO2 95%   BMI 31.89 kg/m  General: Well Developed, well nourished, and in no acute distress.   MSK: C-spine normal-appearing nontender decreased cervical motion limitation range of motion left lateral flexion Upper extremity strength is intact.  Reflexes are intact.  Sensation is intact. Mildly positive left-sided Spurling's test.  Right shoulder normal-appearing Nontender. Normal motion pain with abduction. Strength is intact. Mildly positive empty can test. Negative Hawkins and Neer's test. Negative Yergason's and speeds test. Negative O'Brien and crossover arm compression test.  Pulses capillary fill and sensation are intact distally.    Lab and Radiology Results  Procedure: Real-time Ultrasound Guided Injection of right shoulder subacromial  bursa Device: Philips Affiniti 50G Images permanently stored and available for review in PACS Ultrasound evaluation prior to injection reveals intact rotator cuff tendons with mild to moderate subacromial bursitis.  AC DJD with effusion present Verbal informed consent obtained.  Discussed risks and benefits of procedure. Warned about infection bleeding damage to structures skin hypopigmentation and fat atrophy among others. Patient expresses understanding and agreement Time-out conducted.   Noted no overlying erythema, induration, or other signs of local infection.   Skin prepped in a sterile fashion.   Local anesthesia: Topical Ethyl chloride.   With sterile technique and under real time ultrasound guidance: 40 mg of Kenalog and 2 mL of lidocaine injected into subacromial bursa. Fluid seen entering the bursa.   Completed without difficulty   Pain immediately resolved suggesting accurate placement of the medication.   Advised to call if fevers/chills, erythema, induration, drainage, or persistent bleeding.   Images permanently stored and available for review in the ultrasound unit.  Impression: Technically successful ultrasound guided injection.    X-ray images right shoulder obtained today personally and independently interpreted Humeral head sits slightly inferior to glenoid otherwise normal-appearing.  No acute fractures.  No severe DJD. Await formal radiology review  EXAM: MRI CERVICAL SPINE WITHOUT CONTRAST   TECHNIQUE: Multiplanar, multisequence MR imaging of the cervical spine was performed. No intravenous contrast was administered.   COMPARISON:  11/21/2013   FINDINGS: Alignment: Straightening with slight reversal of the cervical lordosis. No significant listhesis.   Vertebrae: No fracture, evidence of  discitis, or bone lesion. Reactive subchondral marrow signal changes centered at the left C2-3 facet joint with mild adjacent soft tissue edema.   Cord: Normal signal  and morphology.   Posterior Fossa, vertebral arteries, paraspinal tissues: Negative.   Disc levels:   C2-C3: Unremarkable disc. Advanced left facet arthropathy, progressed. No significant foraminal or canal stenosis.   C3-C4: Small central disc protrusion. Moderate left facet arthropathy resulting in mild left foraminal stenosis. No significant canal stenosis. Findings progressed from prior.   C4-C5: Posterior disc osteophyte complex results in slight flattening of the cervical cord with mild canal stenosis. Mild left foraminal stenosis. Findings are similar to prior.   C5-C6: Posterior disc osteophyte complex with left greater than right uncovertebral spurring. Mild impress upon the ventral thecal sac without significant canal stenosis. Mild to moderate left and mild right foraminal stenosis. Findings are similar to prior.   C6-C7: Left paracentral disc osteophyte complex with mild bilateral uncovertebral spurring. Findings result in mild left foraminal stenosis without significant canal stenosis. Findings progressed from prior.   C7-T1: No significant disc protrusion, foraminal stenosis, or canal stenosis.   IMPRESSION: 1. Slight progression of multilevel degenerative changes of the cervical spine, as described above. 2. Mild canal stenosis at C4-5. 3. Mild-to-moderate multilevel bilateral foraminal stenosis most pronounced on the left at C5-6. 4. Advanced left facet arthropathy at C2-3 with associated reactive subchondral marrow signal changes and mild adjacent soft tissue edema. This may be a focal source of pain.     Electronically Signed   By: Duanne Guess D.O.   On: 02/19/2021 10:52   I, Clementeen Graham, personally (independently) visualized and performed the interpretation of the images attached in this note.    Assessment and Plan: 60 y.o. female with right shoulder pain in the setting of chronic neck pain and cervical radiculopathy bilaterally mild to the  right moderate to the left.  Shoulder pain due to subacromial bursitis.  Plan for subacromial bursa injection today and home exercise program teaching shoulder stabilization and rotator cuff strengthening taught in clinic by ATC.  Certainly physical therapy would be helpful in the future if not improving.  Patient has an upcoming epidural steroid injection for her cervical spine in the future and will follow up with Dr. Katrinka Blazing on December 20 for her neck and also now her shoulder.  Certainly she can follow-up with me sooner if needed.  Additionally will discontinue baclofen and try tizanidine at bedtime for the shoulder and neck pain due to muscle dysfunction and spasm as baclofen has not been helpful.  She certainly can continue gabapentin as needed for the radicular pain which now is less.   PDMP not reviewed this encounter. Orders Placed This Encounter  Procedures   [place] LIMITED JOINT SPACE STRUCTURES UP RIGHT(NO LINKED CHARGES)    Order Specific Question:   Reason for Exam (SYMPTOM  OR DIAGNOSIS REQUIRED)    Answer:   R shoulder pain    Order Specific Question:   Preferred imaging location?    Answer:   [facility] Shoulder Right    Standing Status:   Future    Standing Expiration Date:   09/02/2021    Order Specific Question:   Reason for Exam (SYMPTOM  OR DIAGNOSIS REQUIRED)    Answer:   R shoulder pain    Order Specific Question:   Is patient pregnant?    Answer:   No    Order Specific Question:   Preferred imaging  location?    Answer:   [facility]   Meds ordered this encounter  Medications   tiZANidine (ZANAFLEX) 4 MG tablet    Sig: Take 1 tablet (4 mg total) by mouth at bedtime as needed for muscle spasms.    Dispense:  30 tablet    Refill:  1     Discussed warning signs or symptoms. Please see discharge instructions. Patient expresses understanding.   The above documentation has been reviewed and is accurate and complete Clementeen Graham, M.D.

## 2021-08-02 ENCOUNTER — Encounter: Payer: Self-pay | Admitting: Family Medicine

## 2021-08-02 ENCOUNTER — Ambulatory Visit: Payer: Self-pay

## 2021-08-02 ENCOUNTER — Other Ambulatory Visit: Payer: Self-pay

## 2021-08-02 ENCOUNTER — Ambulatory Visit: Payer: BC Managed Care – PPO | Admitting: Family Medicine

## 2021-08-02 ENCOUNTER — Ambulatory Visit (INDEPENDENT_AMBULATORY_CARE_PROVIDER_SITE_OTHER): Payer: BC Managed Care – PPO

## 2021-08-02 VITALS — BP 130/86 | HR 78 | Ht 66.0 in | Wt 197.6 lb

## 2021-08-02 DIAGNOSIS — G8929 Other chronic pain: Secondary | ICD-10-CM

## 2021-08-02 DIAGNOSIS — M25511 Pain in right shoulder: Secondary | ICD-10-CM | POA: Diagnosis not present

## 2021-08-02 DIAGNOSIS — M5030 Other cervical disc degeneration, unspecified cervical region: Secondary | ICD-10-CM

## 2021-08-02 DIAGNOSIS — M5412 Radiculopathy, cervical region: Secondary | ICD-10-CM | POA: Diagnosis not present

## 2021-08-02 MED ORDER — TIZANIDINE HCL 4 MG PO TABS: 4.0000 mg | ORAL_TABLET | Freq: Every evening | ORAL | 1 refills | Status: DC | PRN

## 2021-08-02 NOTE — Patient Instructions (Signed)
Nice to meet you today.  You had a R shoulder injection.  Call or go to the ER if you develop a large red swollen joint with extreme pain or oozing puss.   Please perform the exercise program that we have prepared for you and gone over in detail on a daily basis.  In addition to the handout you were provided you can access your program through: [email or web address]   Your unique program code is:  : [email or web address]  Please get an Xray today before you leave.  Let me know if you'd like to do PT and I can place that referral for you.  Follow-up w/ Dr. Katrinka Blazing on Dec 20th.

## 2021-08-06 NOTE — Progress Notes (Signed)
Right shoulder x-ray looks okay to radiology.

## 2021-08-10 DIAGNOSIS — R928 Other abnormal and inconclusive findings on diagnostic imaging of breast: Secondary | ICD-10-CM | POA: Diagnosis not present

## 2021-08-10 DIAGNOSIS — N6001 Solitary cyst of right breast: Secondary | ICD-10-CM | POA: Diagnosis not present

## 2021-08-10 DIAGNOSIS — R922 Inconclusive mammogram: Secondary | ICD-10-CM | POA: Diagnosis not present

## 2021-08-14 ENCOUNTER — Ambulatory Visit
Admission: RE | Admit: 2021-08-14 | Discharge: 2021-08-14 | Disposition: A | Discharge status: Home / Self Care | Payer: BC Managed Care – PPO | Source: Ambulatory Visit | Attending: Family Medicine | Admitting: Family Medicine

## 2021-08-14 ENCOUNTER — Other Ambulatory Visit: Payer: BC Managed Care – PPO

## 2021-08-14 ENCOUNTER — Other Ambulatory Visit: Payer: Self-pay

## 2021-08-14 DIAGNOSIS — M5412 Radiculopathy, cervical region: Secondary | ICD-10-CM | POA: Diagnosis not present

## 2021-08-14 DIAGNOSIS — M9902 Segmental and somatic dysfunction of thoracic region: Secondary | ICD-10-CM

## 2021-08-14 MED ORDER — TRIAMCINOLONE ACETONIDE 40 MG/ML IJ SUSP (RADIOLOGY)
60.0000 mg | Freq: Once | INTRAMUSCULAR | Status: AC
  Administered 2021-08-14: 60 mg via EPIDURAL

## 2021-08-14 MED ORDER — IOPAMIDOL (ISOVUE-M 300) INJECTION 61%
1.0000 mL | Freq: Once | INTRAMUSCULAR | Status: AC | PRN
  Administered 2021-08-14: 1 mL via EPIDURAL

## 2021-08-14 NOTE — Discharge Instructions (Signed)

## 2021-08-29 ENCOUNTER — Encounter: Payer: Self-pay | Admitting: Family Medicine

## 2021-08-29 DIAGNOSIS — G8929 Other chronic pain: Secondary | ICD-10-CM

## 2021-08-29 DIAGNOSIS — E039 Hypothyroidism, unspecified: Secondary | ICD-10-CM | POA: Diagnosis not present

## 2021-08-29 DIAGNOSIS — M5412 Radiculopathy, cervical region: Secondary | ICD-10-CM

## 2021-08-29 NOTE — Addendum Note (Signed)
Addended by: Rodolph Bong on: 08/29/2021 03:49 PM   Modules accepted: Orders

## 2021-09-09 ENCOUNTER — Other Ambulatory Visit: Payer: Self-pay

## 2021-09-09 ENCOUNTER — Ambulatory Visit (INDEPENDENT_AMBULATORY_CARE_PROVIDER_SITE_OTHER): Payer: BC Managed Care – PPO

## 2021-09-09 DIAGNOSIS — M25511 Pain in right shoulder: Secondary | ICD-10-CM | POA: Diagnosis not present

## 2021-09-09 DIAGNOSIS — G8929 Other chronic pain: Secondary | ICD-10-CM

## 2021-09-10 NOTE — Progress Notes (Signed)
MRI shoulder shows mild rotator cuff tendinitis and a little bit of tendon fraying but no definitive tear.  Recommend proceeding with the epidural steroid injection already arranged for and ordered. You are scheduled to follow-up with Dr. Katrinka Blazing in December 20.  Certainly you could follow-up with me sooner than that if you would like to go over the MRI in further detail and discuss further treatment plan and options however I would like to see how you do after the cervical spine injection.

## 2021-09-11 ENCOUNTER — Other Ambulatory Visit: Payer: Self-pay

## 2021-09-11 ENCOUNTER — Other Ambulatory Visit: Payer: Self-pay | Admitting: Family Medicine

## 2021-09-11 ENCOUNTER — Ambulatory Visit
Admission: RE | Admit: 2021-09-11 | Discharge: 2021-09-11 | Disposition: A | Discharge status: Home / Self Care | Payer: BC Managed Care – PPO | Source: Ambulatory Visit | Attending: Family Medicine | Admitting: Family Medicine

## 2021-09-11 DIAGNOSIS — M5412 Radiculopathy, cervical region: Secondary | ICD-10-CM

## 2021-09-11 DIAGNOSIS — M4722 Other spondylosis with radiculopathy, cervical region: Secondary | ICD-10-CM | POA: Diagnosis not present

## 2021-09-11 MED ORDER — TRIAMCINOLONE ACETONIDE 40 MG/ML IJ SUSP (RADIOLOGY)
60.0000 mg | Freq: Once | INTRAMUSCULAR | Status: AC
  Administered 2021-09-11: 60 mg via EPIDURAL

## 2021-09-11 MED ORDER — IOPAMIDOL (ISOVUE-M 300) INJECTION 61%
1.0000 mL | Freq: Once | INTRAMUSCULAR | Status: AC
  Administered 2021-09-11: 1 mL via EPIDURAL

## 2021-09-11 NOTE — Discharge Instructions (Signed)

## 2021-09-19 ENCOUNTER — Encounter: Payer: Self-pay | Admitting: Family Medicine

## 2021-09-25 ENCOUNTER — Ambulatory Visit: Payer: BC Managed Care – PPO | Admitting: Family Medicine

## 2021-09-25 DIAGNOSIS — R7301 Impaired fasting glucose: Secondary | ICD-10-CM | POA: Diagnosis not present

## 2021-09-25 DIAGNOSIS — E782 Mixed hyperlipidemia: Secondary | ICD-10-CM | POA: Diagnosis not present

## 2021-09-25 DIAGNOSIS — D72829 Elevated white blood cell count, unspecified: Secondary | ICD-10-CM | POA: Diagnosis not present

## 2021-09-27 DIAGNOSIS — D729 Disorder of white blood cells, unspecified: Secondary | ICD-10-CM | POA: Diagnosis not present

## 2021-09-27 DIAGNOSIS — G43909 Migraine, unspecified, not intractable, without status migrainosus: Secondary | ICD-10-CM | POA: Diagnosis not present

## 2021-09-27 DIAGNOSIS — D72829 Elevated white blood cell count, unspecified: Secondary | ICD-10-CM | POA: Diagnosis not present

## 2021-10-04 ENCOUNTER — Encounter: Payer: Self-pay | Admitting: Family Medicine

## 2021-10-04 ENCOUNTER — Ambulatory Visit: Payer: BC Managed Care – PPO | Admitting: Family Medicine

## 2021-10-04 ENCOUNTER — Other Ambulatory Visit: Payer: Self-pay

## 2021-10-04 VITALS — BP 120/74 | HR 74 | Ht 66.0 in | Wt 191.0 lb

## 2021-10-04 DIAGNOSIS — M25511 Pain in right shoulder: Secondary | ICD-10-CM

## 2021-10-04 DIAGNOSIS — G8929 Other chronic pain: Secondary | ICD-10-CM

## 2021-10-04 NOTE — Patient Instructions (Addendum)
Good to see you today.  I've referred you to [facility] for surgical consultation.  Their office will call you to schedule but let us know if you don't hear from them by the end of next week.  Follow-up: as needed

## 2021-10-04 NOTE — Progress Notes (Signed)
I, Christoper Fabian, LAT, ATC, am serving as scribe for Dr. Clementeen Graham.  Kayla Daugherty is a 60 y.o. female who presents to [facility] at [facility] today for f/u cervical radiculopathy and chronic R shoulder pain and MRI review. Pt was last seen by Dr. Denyse Amass on 08/02/21 and was given a R subacromial steroid injection and was taught a HEP and was advised to d/c baclofen and switch to tizanidine. For her neck pain, pt was advised to proceed to [facility] and f/u w/ Dr. Katrinka Blazing. She had her 2nd ESI (R C7-T1) on 09/11/21.   Pt exchanged MyChart messages w/ [place] and was advised to proceed to MRI. Today, pt reports that her neck is doing ok.  She rates her R shoulder pain at a 4-5/10 during the day but notes that her pain can increase to a 10/10 at nighht.  She has been using Salonpas patches at night.  She takes the Tizanidine intermittently but doesn't feel that it helps more than the Baclofen.  Is located in the right shoulder lateral upper arm worse at night and with driving.  Dx imaging: 09/09/21 R shoulder MRI  08/02/21 R shoulder XR  Pertinent review of systems: No fevers or chills  Relevant historical information: Headaches   Exam:  BP 120/74 (BP Location: Left Arm, Patient Position: Sitting, Cuff Size: Normal)    Pulse 74    Ht 5\' 6"  (1.676 m)    Wt 191 lb (86.6 kg)    LMP 04/26/2014 Comment: irregular periods per pt    SpO2 95%    BMI 30.83 kg/m  General: Well Developed, well nourished, and in no acute distress.   MSK: C-spine: Normal.  Normal cervical motion. Right shoulder normal-appearing Nontender. Normal abduction range of motion. Strength testing intact abduction. Impingement testing is mildly positive. Pulses cap refill and sensation are intact distally.    Lab and Radiology Results  EXAM: MRI OF THE RIGHT SHOULDER WITHOUT CONTRAST   TECHNIQUE: Multiplanar, multisequence MR imaging of the shoulder was performed. No intravenous contrast was administered.    COMPARISON:  X-ray shoulder 08/02/2021.   FINDINGS: Rotator cuff: Mild tendinosis of the supraspinatus tendon with fraying along the anterior bursal surface. Infraspinatus tendon is intact. Teres minor tendon is intact. Subscapularis tendon is intact.   Muscles: No atrophy or abnormal signal of the muscles of the rotator cuff.   Biceps long head: Intact and normally positioned.   Acromioclavicular Joint: Mild arthropathy of the acromioclavicular joint. Type I acromion. No significant subacromial/subdeltoid bursal fluid.   Glenohumeral Joint: No joint effusion.  No chondral defect.   Labrum: Grossly intact, but evaluation is limited by lack of intraarticular fluid.   Bones: No marrow abnormality, fracture, or dislocation.   Other: None.   IMPRESSION: 1. Mild tendinosis of the supraspinatus tendon with fraying along the anterior bursal surface. 2.  No acute osseous injury of the right shoulder.     Electronically Signed   By: 08/04/2021 M.D.   On: 09/10/2021 10:07   EXAM: MRI CERVICAL SPINE WITHOUT CONTRAST   TECHNIQUE: Multiplanar, multisequence MR imaging of the cervical spine was performed. No intravenous contrast was administered.   COMPARISON:  11/21/2013   FINDINGS: Alignment: Straightening with slight reversal of the cervical lordosis. No significant listhesis.   Vertebrae: No fracture, evidence of discitis, or bone lesion. Reactive subchondral marrow signal changes centered at the left C2-3 facet joint with mild adjacent soft tissue edema.   Cord: Normal signal and  morphology.   Posterior Fossa, vertebral arteries, paraspinal tissues: Negative.   Disc levels:   C2-C3: Unremarkable disc. Advanced left facet arthropathy, progressed. No significant foraminal or canal stenosis.   C3-C4: Small central disc protrusion. Moderate left facet arthropathy resulting in mild left foraminal stenosis. No significant canal stenosis. Findings progressed from  prior.   C4-C5: Posterior disc osteophyte complex results in slight flattening of the cervical cord with mild canal stenosis. Mild left foraminal stenosis. Findings are similar to prior.   C5-C6: Posterior disc osteophyte complex with left greater than right uncovertebral spurring. Mild impress upon the ventral thecal sac without significant canal stenosis. Mild to moderate left and mild right foraminal stenosis. Findings are similar to prior.   C6-C7: Left paracentral disc osteophyte complex with mild bilateral uncovertebral spurring. Findings result in mild left foraminal stenosis without significant canal stenosis. Findings progressed from prior.   C7-T1: No significant disc protrusion, foraminal stenosis, or canal stenosis.   IMPRESSION: 1. Slight progression of multilevel degenerative changes of the cervical spine, as described above. 2. Mild canal stenosis at C4-5. 3. Mild-to-moderate multilevel bilateral foraminal stenosis most pronounced on the left at C5-6. 4. Advanced left facet arthropathy at C2-3 with associated reactive subchondral marrow signal changes and mild adjacent soft tissue edema. This may be a focal source of pain.     Electronically Signed   By: Duanne Guess D.O.   On: 02/19/2021 10:52 I, Clementeen Graham, personally (independently) visualized and performed the interpretation of the images attached in this note.    Assessment and Plan: 60 y.o. female with  Right shoulder pain. Kayla Daugherty has had some ongoing right shoulder pain located in the right lateral upper arm thought to be due to the rotator cuff tendinitis seen on MRI.  She had a trial of a subacromial injection October 27 which did not help very much.  Additionally she has had trials of home exercise program which helps a little but not enough.  She notes her symptoms are quite bothersome at times and are significantly impacting her quality of life.  At this point I think the majority of her  shoulder pain is coming from her rotator cuff region and I think it be worthwhile to have a surgical consultation to discuss digital subacromial decompression.  She does have some neck pain and cervical radicular pain seen by previous MRI.  She had an epidural steroid injection recently which helped her left-sided neck pain but not her right shoulder pain.  Additionally her cervical MRI from May does not explain her right shoulder pain in my opinion.   PDMP not reviewed this encounter. Orders Placed This Encounter  Procedures   Ambulatory referral to Orthopedic Surgery    Referral Priority:   Routine    Referral Type:   Surgical    Referral Reason:   Specialty Services Required    Requested Specialty:   Orthopedic Surgery    Number of Visits Requested:   1   No orders of the defined types were placed in this encounter.    Discussed warning signs or symptoms. Please see discharge instructions. Patient expresses understanding.   The above documentation has been reviewed and is accurate and complete Clementeen Graham, M.D.  Total encounter time 20 minutes including face-to-face time with the patient and, reviewing past medical record, and charting on the date of service.   Treatment plan and options

## 2021-10-05 DIAGNOSIS — J029 Acute pharyngitis, unspecified: Secondary | ICD-10-CM | POA: Diagnosis not present

## 2021-10-05 DIAGNOSIS — H938X3 Other specified disorders of ear, bilateral: Secondary | ICD-10-CM | POA: Diagnosis not present

## 2021-10-24 ENCOUNTER — Other Ambulatory Visit: Payer: Self-pay | Admitting: Family Medicine

## 2021-10-24 MED ORDER — TIZANIDINE HCL 4 MG PO TABS: 4.0000 mg | ORAL_TABLET | Freq: Every evening | ORAL | 1 refills | Status: DC | PRN

## 2021-10-25 ENCOUNTER — Ambulatory Visit: Payer: BC Managed Care – PPO | Admitting: Orthopedic Surgery

## 2021-10-29 ENCOUNTER — Ambulatory Visit (INDEPENDENT_AMBULATORY_CARE_PROVIDER_SITE_OTHER): Payer: BC Managed Care – PPO

## 2021-10-29 ENCOUNTER — Encounter: Payer: Self-pay | Admitting: Orthopedic Surgery

## 2021-10-29 ENCOUNTER — Ambulatory Visit: Payer: Self-pay

## 2021-10-29 ENCOUNTER — Ambulatory Visit: Payer: BC Managed Care – PPO | Admitting: Family Medicine

## 2021-10-29 ENCOUNTER — Ambulatory Visit: Payer: BC Managed Care – PPO | Admitting: Orthopedic Surgery

## 2021-10-29 ENCOUNTER — Other Ambulatory Visit: Payer: Self-pay

## 2021-10-29 VITALS — BP 138/84 | HR 91 | Ht 66.0 in | Wt 182.6 lb

## 2021-10-29 DIAGNOSIS — M47816 Spondylosis without myelopathy or radiculopathy, lumbar region: Secondary | ICD-10-CM | POA: Diagnosis not present

## 2021-10-29 DIAGNOSIS — M5441 Lumbago with sciatica, right side: Secondary | ICD-10-CM | POA: Diagnosis not present

## 2021-10-29 DIAGNOSIS — M5412 Radiculopathy, cervical region: Secondary | ICD-10-CM | POA: Diagnosis not present

## 2021-10-29 DIAGNOSIS — M545 Low back pain, unspecified: Secondary | ICD-10-CM | POA: Diagnosis not present

## 2021-10-29 DIAGNOSIS — G8929 Other chronic pain: Secondary | ICD-10-CM

## 2021-10-29 DIAGNOSIS — M19011 Primary osteoarthritis, right shoulder: Secondary | ICD-10-CM | POA: Diagnosis not present

## 2021-10-29 DIAGNOSIS — M25511 Pain in right shoulder: Secondary | ICD-10-CM

## 2021-10-29 MED ORDER — PREDNISONE 50 MG PO TABS: 50.0000 mg | ORAL_TABLET | Freq: Every day | ORAL | 0 refills | Status: DC

## 2021-10-29 MED ORDER — METHYLPREDNISOLONE ACETATE 40 MG/ML IJ SUSP
13.3300 mg | INTRAMUSCULAR | Status: AC | PRN
  Administered 2021-10-29: 13.33 mg via INTRA_ARTICULAR

## 2021-10-29 MED ORDER — GABAPENTIN 300 MG PO CAPS: 300.0000 mg | ORAL_CAPSULE | Freq: Three times a day (TID) | ORAL | 2 refills | Status: DC

## 2021-10-29 MED ORDER — LIDOCAINE HCL 1 % IJ SOLN
3.0000 mL | INTRAMUSCULAR | Status: AC | PRN
  Administered 2021-10-29: 3 mL

## 2021-10-29 MED ORDER — BUPIVACAINE HCL 0.25 % IJ SOLN
0.6600 mL | INTRAMUSCULAR | Status: AC | PRN
  Administered 2021-10-29: .66 mL via INTRA_ARTICULAR

## 2021-10-29 NOTE — Patient Instructions (Addendum)
Thank you for coming in today.   I've referred you to PT to [facility].  Their office will call you to schedule but please let us know if you haven't heard from them in one week regarding scheduling.  I've prescribe you Gabapentin and prednisone to your pharmacy.  Please get an Xray today before you leave.   Follow-up: 6 weeks

## 2021-10-29 NOTE — Progress Notes (Signed)
Office Visit Note   Patient: Kayla Daugherty           Date of Birth: 11/12/1960           MRN: [id number] Visit Date: 10/29/2021 Requested by: Rodolph Bong, MD [address] 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  Denies any history of injury.  The pain wakes her from sleep at night.  She is left-hand dominant.  Reports constant pain but it is worse with driving computer work and sleeping.  Denies any loss of range of motion.  On the right-hand side the pain radiates down her her inner proximal arm forearm and into the thumb.  She also reports a lot of axillary pain.  She also states "I have a bad neck".  Hard for her to get comfortable at times.  She does use a lidocaine patch superiorly and laterally on that right shoulder which helps.  She has had no prior neck or shoulder surgery.  She has had multiple epidural steroid injections which have helped her manage the symptoms.  She has also had none arthrogram right shoulder MRI scan as well as cervical spine MRI scan.  Both of those are reviewed with the patient today.              ROS: All systems reviewed are negative as they relate to the chief complaint within the history of present illness.  Patient denies  fevers or chills.   Assessment & Plan: Visit Diagnoses:  1. Pain in right acromioclavicular joint     Plan: Impression is right shoulder and arm pain which is improved by putting her arm up overhead.  She has had reasonable relief from cervical spine injections for left-sided radicular symptoms.  They have also helped the right side to some degree.  She has not had any surgery and is not really keen on any neck or shoulder surgery if alternatives exist.  Her shoulder MRI scan shows mild tendinosis of  the supraspinatus tendon but she does have a little bit of edema in the distal end of the clavicle.  That is really the most significant finding on my review of the study.  Labrum looks intact.  MRI scan of the cervical spine does show predominantly left-sided foraminal stenosis at multiple levels but there is also possible right-sided involvement.  Negative Tinel's at the elbow and no real carpal tunnel compression testing.  Plan today is for diagnostic and therapeutic AC joint injection to see if that can help with some of her superior shoulder pain.  I think she may need to consider whether or not she is having enough symptoms to warrant consultation with a spine surgeon for her cervical spine pathology.  We will see her back in 6 weeks for further discussion.  I did ask her to pay pretty close attention to how her shoulder felt within the first 2 hours after the numbing medicine.  We will reevaluate her entire clinical presentation in 6 weeks.  No indication for intervention at this time outside of the injection in the shoulder.  Follow-Up Instructions: Return in about 6 weeks (around 12/10/2021).   Orders:  Orders Placed This Encounter  Procedures   US Guided Needle Placement - No  Linked Charges   No orders of the defined types were placed in this encounter.     Procedures: Medium Joint Inj: R acromioclavicular on 10/29/2021 10:27 PM Indications: pain and diagnostic evaluation Details: 27 G 1.5 in needle, ultrasound-guided superior approach Medications: 13.33 mg methylPREDNISolone acetate 40 MG/ML; 0.66 mL bupivacaine 0.25 %; 3 mL lidocaine 1 % Outcome: tolerated well, no immediate complications Procedure, treatment alternatives, risks and benefits explained, specific risks discussed. Consent was given by the patient. Immediately prior to procedure a time out was called to verify the correct patient, procedure, equipment, support staff and site/side marked as required. Patient was prepped and  draped in the usual sterile fashion.      Clinical Data: No additional findings.  Objective: Vital Signs: LMP 04/26/2014 Comment: irregular periods per pt   Physical Exam:   Constitutional: Patient appears well-developed HEENT:  Head: Normocephalic Eyes:EOM are normal Neck: Normal range of motion Cardiovascular: Normal rate Pulmonary/chest: Effort normal Neurologic: Patient is alert Skin: Skin is warm Psychiatric: Patient has normal mood and affect   Ortho Exam: Ortho exam demonstrates symmetric passive range of motion bilaterally with motion of 65/95/170.  Rotator cuff strength is intact bilaterally to infraspinatus supraspinatus and subscap muscle testing.  Mild AC joint tenderness on the right no tenderness on the left.  Negative O'Brien's testing bilaterally.  No coarse grinding or crepitus with internal ex rotation of the shoulder at 90 degrees of abduction.  No definite paresthesias C5-T1.  Negative Tinel's of the cubital tunnel on the right with no subluxation of the ulnar nerve.  Negative carpal tunnel compression testing.  Specialty Comments:  No specialty comments available.  Imaging: US Guided Needle Placement - No Linked Charges  Result Date: 10/29/2021 Ultrasound images demonstrate needle placement into the [facility] joint with injection of fluid without complicating features.    PMFS History: Patient Active Problem List   Diagnosis Date Noted   Degenerative disc disease, cervical 06/16/2021   Somatic dysfunction of spine, thoracic 06/16/2021   S/P bilateral salpingo-oophorectomy 01/10/2015    Class: Status post   S/P cystoscopy 01/10/2015    Class: Status post   METATARSALGIA 09/12/2010   FOOT PAIN, RIGHT 09/12/2010   Past Medical History:  Diagnosis Date   Family history of ovarian carcinoma    High risk of ovarian cancer    Hyperlipidemia    Migraines    Mild obstructive sleep apnea    study done Sept 2015--  mild osa per pt in process being fitted for  cpap   Wears glasses    Yeast dermatitis    abdomin-  comes and goes    History reviewed. No pertinent family history.  Past Surgical History:  Procedure Laterality Date   COLONOSCOPY  2013   CYSTOSCOPY  01/10/2015   Procedure: CYSTOSCOPY;  Surgeon: Richardean Chimera, MD;  Location: [facility];  Service: Gynecology;;   LAPAROSCOPIC SALPINGO OOPHERECTOMY Bilateral 01/10/2015   Procedure: LAPAROSCOPIC SALPINGO OOPHORECTOMY;  Surgeon: Richardean Chimera, MD;  Location: [facility];  Service: Gynecology;  Laterality: Bilateral;   TONSILLECTOMY  as child   Social History   Occupational History   Not on file  Tobacco Use   Smoking status: Former    Years: 15.00    Types: Cigarettes    Quit date: 01/03/2005    Years since quitting: 16.8   Smokeless tobacco: Never  Substance and Sexual Activity   Alcohol use: Yes    Comment: social   Drug use: No  Sexual activity: Not on file

## 2021-10-29 NOTE — Progress Notes (Signed)
I, Christoper Fabian, LAT, ATC, am serving as scribe for Dr. Clementeen Graham.  Kayla Daugherty is a 61 y.o. female who presents to [facility] at [facility] today for low back pain and f/u neck pain due to cervical radiculopathy.  She was last seen by Dr. Denyse Amass on 10/04/21 for f/u of cervical radiculopathy and R shoulder MRI review and was referred to ortho for consultation.  She has had 3 cervical ESI on 09/11/21 (R C7-T1) and 08/14/21 (L C7) and 07/02/2021.  Today, pt reports was wondering if she could get another cervical ESI. Pt c/o LBP ongoing for years, worsening over the past 2 weeks. Pt locates pain to the R-side of her low back w/ radiating pain distally along the posterior thigh. Pt notes a hx of a bulging disc at L4-5 that occurred around 2009.  Radiating pain: yes LE numbness/tingling: yes LE weakness: yes Aggravates: forward flexion Treatments tried: Tylenol, lidocaine patches  Dx imaging: 09/09/21 R shoulder MRI             08/02/21 R shoulder XR              C-spine MRI- 02/17/21  C-spine XR- 01/08/21  L-spine MRI- 07/25/09  Pertinent review of systems: No fevers or chills  Relevant historical information: History of lumbar radiculopathy and cervical radiculopathy.  Will follow-up with orthopedic surgery for her shoulder today.   Exam:  BP 138/84    Pulse 91    Ht 5\' 6"  (1.676 m)    Wt 182 lb 9.6 oz (82.8 kg)    LMP 04/26/2014 Comment: irregular periods per pt    SpO2 97%    BMI 29.47 kg/m  General: Well Developed, well nourished, and in no acute distress.   MSK:  C-spine normal-appearing nontender midline.  Decreased cervical motion.  Upper extremity strength is intact.  L-spine nontender midline.  Decreased lumbar motion. Lower extremity strength is intact.  Lower reflexes are intact.  Is a positive right-sided slump test.    Lab and Radiology Results  X-ray images L-spine obtained today personally and independently interpreted. DDD L5-S1.  Facet DJD L4-5  and L5-S1.  No acute fractures. Await formal radiology review  MRI LUMBAR SPINE WITHOUT CONTRAST     Technique:  Multiplanar and multiecho pulse sequences of the lumbar  spine were obtained without intravenous contrast.     Comparison: 10/04/2006     Findings: The sagittal MR images demonstrate normal alignment of  the lumbar vertebral bodies.  They demonstrate normal marrow  signal.  The last full intervertebral disc space is labeled L5-S1  and the conus medullaris terminates at L1.     L1-2:  No significant findings.     L2-3:  No significant findings.     L3-4:  No significant findings.     L4-5:  There is a stable annular rent and a small focal central  disc protrusion with mild impression on the anterior aspect of the  thecal sac.  There is a tiny annular rent in the medial foraminal  area with a associated tiny disc protrusion.  This could  potentially irritate the left L5 nerve root in the lateral recess.  The exiting L4 nerve roots appear normal.     L5-S1:  No significant findings.     IMPRESSION:     1.  Stable annular rent and small focal central disc protrusion at  L4-5.  There is also a tiny annular rent and disc protrusion in  the  left lateral recess area which could potentially irritate the left  L5 nerve root.  2.  No other significant findings or interval change.   Provider: Gerre Pebbles I, Clementeen Graham, personally (independently) visualized and performed the interpretation of the images attached in this note.    Assessment and Plan: 61 y.o. female with  Right lumbar radiculopathy.  This is a relatively new issue today but it is a acute exacerbation of her chronic issue.  She has right leg pain which is consistent with right L5 radiculopathy. She has had 3 cervical epidural steroid injections since September 26.  So would like to avoid lumbar epidural steroid injections if possible.  Plan for trial of gabapentin, backup course of prednisone and trial of  physical therapy and home exercise program taught in clinic today by ATC. Additionally will get x-ray lumbar spine today and check back in 6 weeks.  If not improved will obtain MRI for potential epidural steroid injection planning.  As for her cervical spine she is much improved from previously but still somewhat symptomatic.  She has used all 3 of her epidural steroid injections so we will have to wait before doing more of those.   PDMP not reviewed this encounter. Orders Placed This Encounter  Procedures   DG Lumbar Spine 2-3 Views    Standing Status:   Future    Number of Occurrences:   1    Standing Expiration Date:   10/29/2022    Order Specific Question:   Reason for Exam (SYMPTOM  OR DIAGNOSIS REQUIRED)    Answer:   low back pain    Order Specific Question:   Preferred imaging location?    Answer:   [facility]    Order Specific Question:   Is patient pregnant?    Answer:   No   Ambulatory referral to Physical Therapy    Referral Priority:   Routine    Referral Type:   Physical Medicine    Referral Reason:   Specialty Services Required    Requested Specialty:   Physical Therapy    Number of Visits Requested:   1   Meds ordered this encounter  Medications   gabapentin (NEURONTIN) 300 MG capsule    Sig: Take 1 capsule (300 mg total) by mouth 3 (three) times daily.    Dispense:  90 capsule    Refill:  2     Discussed warning signs or symptoms. Please see discharge instructions. Patient expresses understanding.  The above documentation has been reviewed and is accurate and complete Clementeen Graham, M.D.

## 2021-10-30 NOTE — Progress Notes (Signed)
Lumbar spine x-ray shows some arthritis changes and a transitional L5 vertebrae which can sometimes produce back pain.

## 2021-11-06 IMAGING — XA DG INJECT/[PERSON_NAME] INC NEEDLE/CATH/PLC EPI/CERV/THOR W/IMG
2 series · 2 of 2 positions shown · non-contrast
Comparison: none

CLINICAL DATA: Cervical radiculopathy. Left neck pain. Right
shoulder pain. Displacement of the cervical discs from C3-4 through
C6-7. Asymmetric left foraminal narrowing at C3-4, C4-5 and C6-7.
Bilateral foraminal narrowing at C5-6.

[Series 2: ortho standard · 1 of 1 slices shown (1 of 2)]
[im 1/1]
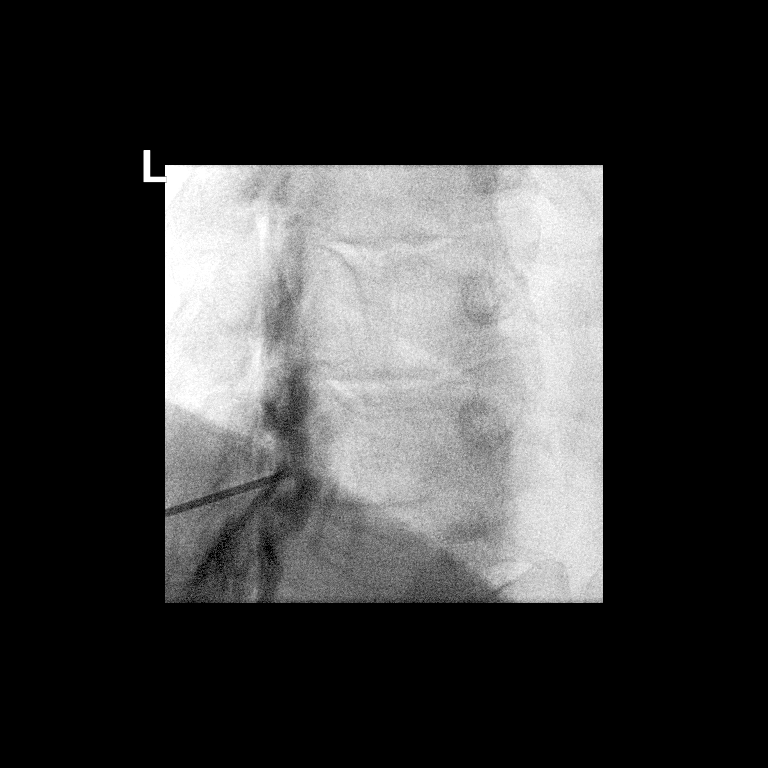

[Series 3: ortho standard · 1 of 1 slices shown (2 of 2)]
[im 1/1]
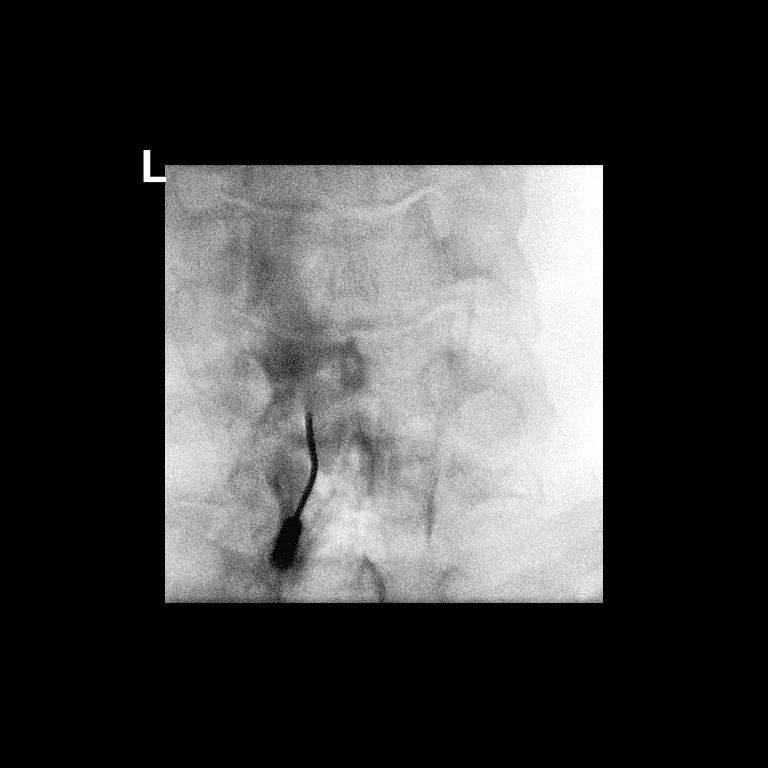

[2 of 2 positions shown; findings below may reference images not displayed]

FLUOROSCOPY TIME:  Radiation Exposure Index (as provided by the
fluoroscopic device): 10.56 uGy*m2

PROCEDURE:
CERVICAL EPIDURAL INJECTION

An interlaminar approach was performed on the left at C6-7. A 20
gauge epidural needle was advanced using loss-of-resistance
technique.

DIAGNOSTIC EPIDURAL INJECTION

Injection of Isovue-M 300 shows a good epidural pattern with spread
above and below the level of needle placement, primarily on the
left. No vascular opacification is seen. THERAPEUTIC

EPIDURAL INJECTION

1.5 ml of Kenalog 40 mixed with 1 ml of 1% Lidocaine and 2 ml of
normal saline were then instilled. The procedure was well-tolerated,
and the patient was discharged thirty minutes following the
injection in good condition.
IMPRESSION: Technically successful first epidural injection on the left at C6-7.

## 2021-11-12 ENCOUNTER — Ambulatory Visit: Payer: BC Managed Care – PPO | Admitting: Physical Therapy

## 2021-11-12 DIAGNOSIS — R7303 Prediabetes: Secondary | ICD-10-CM | POA: Diagnosis not present

## 2021-11-12 DIAGNOSIS — E785 Hyperlipidemia, unspecified: Secondary | ICD-10-CM | POA: Diagnosis not present

## 2021-11-12 DIAGNOSIS — I10 Essential (primary) hypertension: Secondary | ICD-10-CM | POA: Diagnosis not present

## 2021-11-12 DIAGNOSIS — E538 Deficiency of other specified B group vitamins: Secondary | ICD-10-CM | POA: Diagnosis not present

## 2021-11-21 ENCOUNTER — Telehealth: Payer: Self-pay | Admitting: Orthopedic Surgery

## 2021-11-21 NOTE — Telephone Encounter (Signed)
Received call from patient. Needs copy of records. I emailed release form to her [email or web address]

## 2021-12-10 ENCOUNTER — Ambulatory Visit: Payer: BC Managed Care – PPO | Admitting: Family Medicine

## 2021-12-10 ENCOUNTER — Ambulatory Visit: Payer: BC Managed Care – PPO | Admitting: Orthopedic Surgery

## 2021-12-10 ENCOUNTER — Encounter: Payer: Self-pay | Admitting: Orthopedic Surgery

## 2021-12-10 ENCOUNTER — Other Ambulatory Visit: Payer: Self-pay

## 2021-12-10 DIAGNOSIS — M19011 Primary osteoarthritis, right shoulder: Secondary | ICD-10-CM

## 2021-12-10 NOTE — Progress Notes (Signed)
? ?Office Visit Note ?  ?Patient: Kayla Daugherty           ?Date of Birth: 19-Apr-1961           ?MRN: [id number] ?Visit Date: 12/10/2021 ?Requested by: Mayra Neer, MD ?[address] ?[address] ?[place],  [place] [address] ?PCP: Mayra Neer, MD ? ?Subjective: ?Chief Complaint  ?Patient presents with  ? Right Shoulder - Follow-up  ? ? ?HPI: Kayla Daugherty is a 61 year old patient with right shoulder pain.  She had an AC joint injection 10/29/2021.  She did get about 50% relief of her shoulder pain for a month.  The pain still wakes her from sleep at night.  Reports a pulling sensation.  Denies any numbness and tingling in the right upper extremity.  She has occasional radiation of the pain to the thumb.  She did not get immediate relief in the 1 to 2 hours after the injection but did take 4 to 5 days for her to start feeling better with the shot.  She uses a pillow under her arm.  She cannot play golf secondary to her neck.  She has a backpack type trip in August planned where she will be going to [place].  She has had symptoms in the shoulder for 2 years.  MRI of the cervical spine shows left-sided facet arthropathy at C2-3 and mild to moderate by foraminal stenosis most pronounced on the left at C5-6.  MRI scan of the shoulder demonstrates arthropathy of the [facility] joint with no significant subacromial fluid.  Labrum intact and rotator cuff intact nonarthrogram MRI shoulder was performed. ?             ?ROS: All systems reviewed are negative as they relate to the chief complaint within the history of present illness.  Patient denies  fevers or chills. ? ? ?Assessment & Plan: ?Visit Diagnoses:  ?1. Arthritis of right acromioclavicular joint   ? ? ?Plan: Impression is right-sided shoulder pain which appears to be localizing on exam and radiographically to the [facility] joint.  Structurally the shoulder otherwise appears intact.  In this nonarthrogram study it is possible that she may also have biceps and labral pathology  superiorly.  That can be addressed at the time of arthroscopic shoulder joint evaluation.  Plan at this time is for shoulder arthroscopy with evaluation of the superior labrum and possible biceps tenodesis with arthroscopic distal clavicle excision.  The risk and benefits of the procedure are discussed with the patient including but limited to infection nerve vessel damage incomplete pain relief as well as shoulder stiffness.  Rehabilitation plan discussed.  All questions answered.  I think it is possible she may have some symptoms coming from the neck but based on duration of symptoms as well as improvement with AC joint injection I think it would be reasonable to proceed with arthroscopic intervention in the shoulder at this time. ? ?Follow-Up Instructions: No follow-ups on file.  ? ?Orders:  ?No orders of the defined types were placed in this encounter. ? ?No orders of the defined types were placed in this encounter. ? ? ? ? Procedures: ?No procedures performed ? ? ?Clinical Data: ?No additional findings. ? ?Objective: ?Vital Signs: LMP 04/26/2014 Comment: irregular periods per pt  ? ?Physical Exam:  ? ?Constitutional: Patient appears well-developed ?HEENT:  ?Head: Normocephalic ?Eyes:EOM are normal ?Neck: Normal range of motion ?Cardiovascular: Normal rate ?Pulmonary/chest: Effort normal ?Neurologic: Patient is alert ?Skin: Skin is warm ?Psychiatric: Patient has normal mood and  affect ? ? ?Ortho Exam: Ortho exam demonstrates tenderness to palpation of the [facility] joint right versus left.  There are some pain with crossarm adduction on the right versus left.  Passive shoulder range of motion bilaterally is 65/95/175.  Rotator cuff strength excellent infraspinatus supraspinatus subscap muscle testing on the right-hand side with equivocal O'Brien's testing on the right negative on the left.  She does have a little crepitus with internal/external rotation of the right arm which localizes to the [facility] joint. ? ?Specialty  Comments:  ?No specialty comments available. ? ?Imaging: ?No results found. ? ? ?PMFS History: ?Patient Active Problem List  ? Diagnosis Date Noted  ? Degenerative disc disease, cervical 06/16/2021  ? Somatic dysfunction of spine, thoracic 06/16/2021  ? S/P bilateral salpingo-oophorectomy 01/10/2015  ?  Class: Status post  ? S/P cystoscopy 01/10/2015  ?  Class: Status post  ? METATARSALGIA 09/12/2010  ? FOOT PAIN, RIGHT 09/12/2010  ? ?Past Medical History:  ?Diagnosis Date  ? Family history of ovarian carcinoma   ? High risk of ovarian cancer   ? Hyperlipidemia   ? Migraines   ? Mild obstructive sleep apnea   ? study done Sept 2015--  mild osa per pt in process being fitted for cpap  ? Wears glasses   ? Yeast dermatitis   ? abdomin-  comes and goes  ?  ?History reviewed. No pertinent family history.  ?Past Surgical History:  ?Procedure Laterality Date  ? COLONOSCOPY  2013  ? CYSTOSCOPY  01/10/2015  ? Procedure: CYSTOSCOPY;  Surgeon: Arvella Nigh, MD;  Location: [facility];  Service: Gynecology;;  ? LAPAROSCOPIC SALPINGO OOPHERECTOMY Bilateral 01/10/2015  ? Procedure: LAPAROSCOPIC SALPINGO OOPHORECTOMY;  Surgeon: Arvella Nigh, MD;  Location: [facility];  Service: Gynecology;  Laterality: Bilateral;  ? TONSILLECTOMY  as child  ? ?Social History  ? ?Occupational History  ? Not on file  ?Tobacco Use  ? Smoking status: Former  ?  Years: 15.00  ?  Types: Cigarettes  ?  Quit date: 01/03/2005  ?  Years since quitting: 16.9  ? Smokeless tobacco: Never  ?Substance and Sexual Activity  ? Alcohol use: Yes  ?  Comment: social  ? Drug use: No  ? Sexual activity: Not on file  ? ? ? ? ? ?

## 2021-12-10 NOTE — Progress Notes (Deleted)
? ?  I, Philbert Riser, LAT, ATC acting as a scribe for Clementeen Graham, MD. ? ?Kayla Daugherty is a 61 y.o. female who presents to [facility] at [facility] today for chronic R-sided LBP and cervical radiculopathy. She has had 3 cervical ESI on 09/11/21 (R C7-T1) and 08/14/21 (L C7) and 07/02/2021. Pt notes a hx of a bulging disc at L4-5 that occurred around 2009. Pt was last seen by Dr. Denyse Amass on 10/29/21 and was prescribed gabapentin, backup course of prednisone, and was taught HEP. Today, pt reports ? ?Dx imaging: 10/29/21 L-spine XR ?02/17/21 C-spine MRI ?01/08/21 C-spine XR ?11/21/13 C-spine MRI ?11/05/13 C-spine XR ?07/25/09 L-spine MRI ? ?Pertinent review of systems: *** ? ?Relevant historical information: *** ? ? ?Exam:  ?LMP 04/26/2014 Comment: irregular periods per pt  ?General: Well Developed, well nourished, and in no acute distress.  ? ?MSK: *** ? ? ? ?Lab and Radiology Results ?No results found for this or any previous visit (from the past 72 hour(s)). ?No results found. ? ? ? ? ?Assessment and Plan: ?61 y.o. female with *** ? ? ?PDMP not reviewed this encounter. ?No orders of the defined types were placed in this encounter. ? ?No orders of the defined types were placed in this encounter. ? ? ? ?Discussed warning signs or symptoms. Please see discharge instructions. Patient expresses understanding. ? ? ?*** ? ?

## 2021-12-11 ENCOUNTER — Ambulatory Visit: Payer: BC Managed Care – PPO | Admitting: Physical Therapy

## 2021-12-12 ENCOUNTER — Encounter: Payer: Self-pay | Admitting: Family Medicine

## 2021-12-17 NOTE — Progress Notes (Unsigned)
° °  I, Christoper Fabian, LAT, ATC, am serving as scribe for Dr. Clementeen Graham.  Kayla Daugherty is a 61 y.o. female who presents to [facility] at [facility] today for f/u of L-sided neck pain due to cervical radiculopathy.  She was last seen by Dr. Denyse Amass on 10/29/21 for both LBP and f/u of neck pain.  She was prescribed gabapentin and a short course of prednisone.  She was also referred to PT at [facility] for her LBP but never completed any visits.  She has had 3 cervical ESI on 09/11/21 (R C7-T1) and 08/14/21 (L C7) and 07/02/2021.  Today, pt reports .  She is also scheduled for R shoulder surgery w/ Dr. August Saucer on 01/01/22.  Dx imaging: 09/09/21 R shoulder MRI             08/02/21 R shoulder XR              C-spine MRI- 02/17/21             C-spine XR- 01/08/21             L-spine MRI- 07/25/09 Pertinent review of systems: ***  Relevant historical information: ***   Exam:  LMP 04/26/2014 Comment: irregular periods per pt  General: Well Developed, well nourished, and in no acute distress.   MSK: ***    Lab and Radiology Results No results found for this or any previous visit (from the past 72 hour(s)). No results found.     Assessment and Plan: 61 y.o. female with ***   PDMP not reviewed this encounter. No orders of the defined types were placed in this encounter.  No orders of the defined types were placed in this encounter.    Discussed warning signs or symptoms. Please see discharge instructions. Patient expresses understanding.   ***

## 2021-12-18 ENCOUNTER — Other Ambulatory Visit: Payer: Self-pay

## 2021-12-18 ENCOUNTER — Encounter: Payer: Self-pay | Admitting: Family Medicine

## 2021-12-18 ENCOUNTER — Ambulatory Visit: Payer: BC Managed Care – PPO | Admitting: Family Medicine

## 2021-12-18 ENCOUNTER — Telehealth: Payer: Self-pay | Admitting: Orthopedic Surgery

## 2021-12-18 VITALS — BP 106/78 | HR 86 | Ht 66.0 in | Wt 180.8 lb

## 2021-12-18 DIAGNOSIS — M542 Cervicalgia: Secondary | ICD-10-CM | POA: Diagnosis not present

## 2021-12-18 DIAGNOSIS — M5412 Radiculopathy, cervical region: Secondary | ICD-10-CM

## 2021-12-18 NOTE — Patient Instructions (Addendum)
Good to see you today. ? ?I've referred you to Dr. Alvester Morin for L C2-3 facet injections.  Their office will call you to schedule. ? ?Follow-up: as needed ? ? ?

## 2021-12-18 NOTE — Telephone Encounter (Signed)
Pt submitted medical release form, FMLA forms, and $25.00 cash payment to [employer]. Accepted 12/18/21 ?

## 2021-12-24 ENCOUNTER — Telehealth: Payer: Self-pay

## 2021-12-24 ENCOUNTER — Other Ambulatory Visit: Payer: Self-pay

## 2021-12-24 NOTE — Pre-Procedure Instructions (Addendum)
Surgical Instructions ? ? ? Your procedure is scheduled on Tuesday, March 28. ? Report to [facility] Main Entrance "A" at 12:30 P.M., then check in with the Admitting office. ? Call this number if you have problems the morning of surgery: ? [phone] ? ? If you have any questions prior to your surgery date call [phone]: Open Monday-Friday 8am-4pm ? ? ? Remember: ? Do not eat after midnight the night before your surgery ? ?You may drink clear liquids until 11:30AM the morning of your surgery.   ?Clear liquids allowed are: Water, Non-Citrus Juices (without pulp), Carbonated Beverages, Clear Tea, Black Coffee ONLY (NO MILK, CREAM OR POWDERED CREAMER of any kind), and Gatorade ? ?Patient Instructions ? ?The night before surgery:  ?No food after midnight. ONLY clear liquids after midnight ? ?The day of surgery (if you do NOT have diabetes):  ?Drink ONE (1) Pre-Surgery Clear Ensure by 11:30AM the morning of surgery. Drink in one sitting. Do not sip.  ?This drink was given to you during your hospital  ?pre-op appointment visit. ? ?Nothing else to drink after completing the  ?Pre-Surgery Clear Ensure. ? ?       If you have questions, please contact your surgeon?s office. ? ?  ? Take these medicines the morning of surgery with A SIP OF WATER:  ? ?gabapentin (NEURONTIN) ?finasteride (PROPECIA)  ?Livalo (Pitavastatin) ?NP THYROID ?  acetaminophen (TYLENOL) if needed ?rizatriptan (MAXALT) if needed ? ?As of today, STOP taking any Aspirin (unless otherwise instructed by your surgeon), Celecoxib (Celebrex), Aleve, Naproxen, Ibuprofen, Motrin, Advil, Goody's, BC's, all herbal medications, fish oil, and all vitamins. ? ?         ?Do not wear jewelry or makeup ?Do not wear lotions, powders, perfumes/colognes, or deodorant. ?Do not shave 48 hours prior to surgery.  Men may shave face and neck. ?Do not bring valuables to the hospital. ?Do not wear nail polish, gel polish, artificial nails, or any other type of covering on  natural nails (fingers and toes) ?If you have artificial nails or gel coating that need to be removed by a nail salon, please have this removed prior to surgery. Artificial nails or gel coating may interfere with anesthesia's ability to adequately monitor your vital signs. ? ?[facility] is not responsible for any belongings or valuables. .  ? ?Do NOT Smoke (Tobacco/Vaping)  24 hours prior to your procedure ? ?If you use a CPAP at night, you may bring your mask for your overnight stay. ?  ?Contacts, glasses, hearing aids, dentures or partials may not be worn into surgery, please bring cases for these belongings ?  ?For patients admitted to the hospital, discharge time will be determined by your treatment team. ?  ?Patients discharged the day of surgery will not be allowed to drive home, and someone needs to stay with them for 24 hours. ? ?NO VISITORS WILL BE ALLOWED IN PRE-OP WHERE PATIENTS ARE PREPPED FOR SURGERY.  ONLY 1 SUPPORT PERSON MAY BE PRESENT IN THE WAITING ROOM WHILE YOU ARE IN SURGERY.  IF YOU ARE TO BE ADMITTED, ONCE YOU ARE IN YOUR ROOM YOU WILL BE ALLOWED TWO (2) VISITORS. 1 (ONE) VISITOR MAY STAY OVERNIGHT BUT MUST ARRIVE TO THE ROOM BY 8pm.  Minor children may have two parents present. Special consideration for safety and communication needs will be reviewed on a case by case basis. ? ?Special instructions:   ? ?Oral Hygiene is also important to reduce your risk of infection.  Remember -  BRUSH YOUR TEETH THE MORNING OF SURGERY WITH YOUR REGULAR TOOTHPASTE ? ? ?[facility]- Preparing For Surgery ? ?Before surgery, you can play an important role. Because skin is not sterile, your skin needs to be as free of germs as possible. You can reduce the number of germs on your skin by washing with CHG (chlorahexidine gluconate) Soap before surgery.  CHG is an antiseptic cleaner which kills germs and bonds with the skin to continue killing germs even after washing.   ? ? ?Please do not use if you have an  allergy to CHG or antibacterial soaps. If your skin becomes reddened/irritated stop using the CHG.  ?Do not shave (including legs and underarms) for at least 48 hours prior to first CHG shower. It is OK to shave your face. ? ?Please follow these instructions carefully. ?  ? ? Shower the NIGHT BEFORE SURGERY and the MORNING OF SURGERY with CHG Soap.  ? If you chose to wash your hair, wash your hair first as usual with your normal shampoo. After you shampoo, rinse your hair and body thoroughly to remove the shampoo.  Then [employer] and genitals (private parts) with your normal soap and rinse thoroughly to remove soap. ? ?After that Use CHG Soap as you would any other liquid soap. You can apply CHG directly to the skin and wash gently with a scrungie or a clean washcloth.  ? ?Apply the CHG Soap to your body ONLY FROM THE NECK DOWN.  Do not use on open wounds or open sores. Avoid contact with your eyes, ears, mouth and genitals (private parts). Wash Face and genitals (private parts)  with your normal soap.  ? ?Wash thoroughly, paying special attention to the area where your surgery will be performed. ? ?Thoroughly rinse your body with warm water from the neck down. ? ?DO NOT shower/wash with your normal soap after using and rinsing off the CHG Soap. ? ?Pat yourself dry with a CLEAN TOWEL. ? ?Wear CLEAN PAJAMAS to bed the night before surgery ? ?Place CLEAN SHEETS on your bed the night before your surgery ? ?DO NOT SLEEP WITH PETS. ? ? ?Day of Surgery: ? ?Take a shower with CHG soap. ?Wear Clean/Comfortable clothing the morning of surgery ?Do not apply any deodorants/lotions.   ?Remember to brush your teeth WITH YOUR REGULAR TOOTHPASTE. ? ? ? ?Notify your provider: ?if you are in close contact with someone who has COVID  ?or if you develop a fever of 100.4 or greater, sneezing, cough, sore throat, shortness of breath or body aches. ? ?  ?Please read over the following fact sheets that you were given.  ? ?

## 2021-12-24 NOTE — Telephone Encounter (Signed)
Dr. Dawayne Cirri would like to do a peer to peer for [facility] equipment for patient.  Stated that he can call whenever he has time.  CB# [phone].  ?

## 2021-12-25 ENCOUNTER — Encounter (HOSPITAL_COMMUNITY)
Admission: RE | Admit: 2021-12-25 | Discharge: 2021-12-25 | Disposition: A | Discharge status: Home / Self Care | Payer: BC Managed Care – PPO | Source: Ambulatory Visit | Attending: Orthopedic Surgery | Admitting: Orthopedic Surgery

## 2021-12-25 ENCOUNTER — Encounter (HOSPITAL_COMMUNITY): Payer: Self-pay

## 2021-12-25 ENCOUNTER — Other Ambulatory Visit: Payer: Self-pay

## 2021-12-25 VITALS — BP 106/72 | HR 71 | Temp 98.2°F | Resp 17 | Ht 66.0 in | Wt 175.4 lb

## 2021-12-25 DIAGNOSIS — Z01818 Encounter for other preprocedural examination: Secondary | ICD-10-CM | POA: Diagnosis not present

## 2021-12-25 HISTORY — DX: Unspecified osteoarthritis, unspecified site: M19.90

## 2021-12-25 HISTORY — DX: Anxiety disorder, unspecified: F41.9

## 2021-12-25 HISTORY — DX: Nausea with vomiting, unspecified: Z98.890

## 2021-12-25 HISTORY — DX: Essential (primary) hypertension: I10

## 2021-12-25 HISTORY — DX: Nausea with vomiting, unspecified: R11.2

## 2021-12-25 LAB — BASIC METABOLIC PANEL
Anion gap: 9 (ref 5–15)
BUN: 18 mg/dL (ref 6–20)
CO2: 24 mmol/L (ref 22–32)
Calcium: 9.5 mg/dL (ref 8.9–10.3)
Chloride: 106 mmol/L (ref 98–111)
Creatinine, Ser: 0.69 mg/dL (ref 0.44–1.00)
GFR, Estimated: >60 mL/min (ref >60)
Glucose, Bld: 114 mg/dL — ABNORMAL HIGH (ref 70–99)
Potassium: 3.6 mmol/L (ref 3.5–5.1)
Sodium: 139 mmol/L (ref 135–145)

## 2021-12-25 LAB — CBC
HCT: 39.2 % (ref 36.0–46.0)
Hemoglobin: 13.8 g/dL (ref 12.0–15.0)
MCH: 31.4 pg (ref 26.0–34.0)
MCHC: 35.2 g/dL (ref 30.0–36.0)
MCV: 89.3 fL (ref 80.0–100.0)
Platelets: 363 10*3/uL (ref 150–400)
RBC: 4.39 MIL/uL (ref 3.87–5.11)
RDW: 11.3 % — ABNORMAL LOW (ref 11.5–15.5)
WBC: 7.1 10*3/uL (ref 4.0–10.5)
nRBC: 0 % (ref 0.0–0.2)

## 2021-12-25 NOTE — Progress Notes (Signed)
PCP - Lupita Raider ?Cardiologist - denies ? ?Chest x-ray - N/A ?EKG - 12/25/2021  ?Stress Test - denies ?ECHO - denies ?Cardiac Cath - denies ? ?Sleep Study - pt reports sleep study in 2015, but unsure where this was done ?CPAP - pt states she no longer uses a CPAP ? ?Aspirin Instructions- pt does not take ASA ? ?ERAS Protcol - order for ERAS with pre-surgery Ensure ? ?COVID TEST- N/A ? ? ?Anesthesia review: no ? ?Patient denies shortness of breath, fever, cough and chest pain at PAT appointment ? ? ?All instructions explained to the patient, with a verbal understanding of the material. Patient agrees to go over the instructions while at home for a better understanding. The opportunity to ask questions was provided. ? ? ?

## 2021-12-26 ENCOUNTER — Encounter: Payer: Self-pay | Admitting: Physical Therapy

## 2021-12-26 ENCOUNTER — Ambulatory Visit: Payer: BC Managed Care – PPO | Attending: Family Medicine | Admitting: Physical Therapy

## 2021-12-26 DIAGNOSIS — M545 Low back pain, unspecified: Secondary | ICD-10-CM | POA: Insufficient documentation

## 2021-12-26 DIAGNOSIS — M6281 Muscle weakness (generalized): Secondary | ICD-10-CM | POA: Insufficient documentation

## 2021-12-26 DIAGNOSIS — G8929 Other chronic pain: Secondary | ICD-10-CM | POA: Diagnosis not present

## 2021-12-26 DIAGNOSIS — M5441 Lumbago with sciatica, right side: Secondary | ICD-10-CM | POA: Diagnosis not present

## 2021-12-26 NOTE — Therapy (Signed)
[facility] ?[address] ?[place], [place], [address] ?Phone: [phone]   Fax:  [phone] ? ?Physical Therapy Evaluation ? ?Patient Details  ?Name: Kayla Daug[facility] ?MRN: [id number] ?Date of Birth: 11/03/1960 ?Referring Provider (PT): Clementeen Graham ? ? ?Encounter Date: 12/26/2021 ? ? PT End of Session - 12/26/21 1750   ? ? Visit Number 1   ? Number of Visits 1   ? Authorization Type BCBS   ? Authorization Time Period 12/26/21 to 12/26/21   ? PT Start Time 1703   ? PT Stop Time 1744   ? PT Time Calculation (min) 41 min   ? Activity Tolerance Patient tolerated treatment well   ? Behavior During Therapy [facility] for tasks assessed/performed   ? ?  ?  ? ?  ? ? ?Past Medical History:  ?Diagnosis Date  ? Anxiety   ? Arthritis   ? Family history of ovarian carcinoma   ? High risk of ovarian cancer   ? Hyperlipidemia   ? Hypertension   ? Migraines   ? Mild obstructive sleep apnea   ? study done Sept 2015--  mild osa per pt in process being fitted for cpap  ? PONV (postoperative nausea and vomiting)   ? Wears glasses   ? Yeast dermatitis   ? abdomin-  comes and goes  ? ? ?Past Surgical History:  ?Procedure Laterality Date  ? COLONOSCOPY  2013  ? CYSTOSCOPY  01/10/2015  ? Procedure: CYSTOSCOPY;  Surgeon: Richardean Chimera, MD;  Location: [facility];  Service: Gynecology;;  ? LAPAROSCOPIC SALPINGO OOPHERECTOMY Bilateral 01/10/2015  ? Procedure: LAPAROSCOPIC SALPINGO OOPHORECTOMY;  Surgeon: Richardean Chimera, MD;  Location: [facility];  Service: Gynecology;  Laterality: Bilateral;  ? TONSILLECTOMY  as child  ? ? ?There were no vitals filed for this visit. ? ? ? Subjective Assessment - 12/26/21 1706   ? ? Subjective My back has been bothering me for some time, I've got a history of herniated discs in my neck and low back. No back surgeries. I'm having rotator cuff surgery in 6 days. I've had back injections and epidurals in the past for my back. I've never had PT  for my back before   ? Patient Stated Goals avoid back surgery   ? Currently in Pain? Yes   ? Pain Score 2    ? Pain Location Back   ? Pain Orientation Left;Right   ? Pain Descriptors / Indicators Pressure;Tightness   ? Pain Type Chronic pain   ? Pain Radiating Towards none   ? Pain Onset More than a month ago   ? Pain Frequency Constant   ? Aggravating Factors  unsure   ? Pain Relieving Factors laying down flat   ? Effect of Pain on Daily Activities moderate, keeps me from being active   ? ?  ?  ? ?  ? ? ? ? ? [facility] PT Assessment - 12/26/21 0001   ? ?  ? Assessment  ? Medical Diagnosis chronic low back pain   ? Referring Provider (PT) Clementeen Graham   ? Onset Date/Surgical Date --   chronic  ? Next MD Visit Denyse Amass after shoulder surgery   ? Prior Therapy no PT before   ?  ? Precautions  ? Precautions Other (comment)   ? Precaution Comments latex allergy   ?  ? Restrictions  ? Weight Bearing Restrictions No   ?  ? Balance Screen  ? Has  the patient fallen in the past 6 months No   ? Has the patient had a decrease in activity level because of a fear of falling?  No   ? Is the patient reluctant to leave their home because of a fear of falling?  No   ?  ? Home Environment  ? Living Environment Private residence   ?  ? Prior Function  ? Level of Independence Independent;Independent with basic ADLs   ? Vocation Full time employment   ? 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 Leisure volleyball, golf, bowling   ?  ? Observation/Other Assessments  ? Observations SLR test (+) RLE; LLD (-)   ? Focus on Therapeutic Outcomes (FOTO)  59   ?  ? ROM / Strength  ? AROM / PROM / Strength AROM;Strength   ?  ? AROM  ? AROM Assessment Site Lumbar   ? Lumbar Flexion mild limitation   ? Lumbar Extension moderate limitation   ? Lumbar - Right Side Bend moderate limitation   ? Lumbar - Left Side Bend moderate limitation   ?  ? Strength  ? Strength Assessment Site Hip;Knee;Ankle   ? Right/Left Hip Right;Left   ? Right Hip  Flexion 4/5   ? Right Hip Extension 3/5   ? Right Hip ABduction 4+/5   ? Left Hip Flexion 4/5   ? Left Hip Extension 3/5   ? Left Hip ABduction 4+/5   ? Right/Left Knee Right;Left   ? Right Knee Flexion 4+/5   ? Right Knee Extension 4+/5   ? Left Knee Flexion 4+/5   ? Left Knee Extension 4+/5   ? Right/Left Ankle Left;Right   ? Right Ankle Dorsiflexion 4+/5   ? Left Ankle Dorsiflexion 4+/5   ?  ? Flexibility  ? Soft Tissue Assessment /Muscle Length yes   ? Hamstrings mild limitations B   ? Quadriceps moderate limitation   ? Piriformis mild limitations B   ? ?  ?  ? ?  ? ? ? ? ? ? ? ? ? ? ? ? ? ?Objective measurements completed on examination: See above findings.  ? ? ? ? ? [facility] Adult PT Treatment/Exercise - 12/26/21 0001   ? ?  ? Exercises  ? Exercises Knee/Hip   ?  ? Knee/Hip Exercises: Stretches  ? Active Hamstring Stretch Both;1 rep;30 seconds   ?  ? Knee/Hip Exercises: Standing  ? Other Standing Knee Exercises lumbar lateral flexionand rotation x10 B with 5 second holds; L stretch at talbe for lumbar stretch   ?  ? Knee/Hip Exercises: Seated  ? Other Seated Knee/Hip Exercises seated TA set 1x15 with 3 second holds, marches with TA set 1x20   ?  ? Knee/Hip Exercises: Supine  ? Bridges Both;1 set;10 reps   ? Bridges Limitations 2 second holds   ? ?  ?  ? ?  ? ? ? ? ? ? ? ? ? ? PT Education - 12/26/21 1749   ? ? Education Details exam findings, POC, HEP; eval only today so she can focus on shoulder surgery and rehab, would recommend that if this HEP does not address her back pain it may be worth her time to revisit back pain in PT after her shoulder issues are taken care of   ? Person(s) Educated Patient   ? Methods Explanation;Demonstration;Handout   ? Comprehension Verbalized understanding;Returned demonstration   ? ?  ?  ? ?  ? ? ? PT  Short Term Goals - 12/26/21 1752   ? ?  ? PT SHORT TERM GOAL #1  ? Title Will be able to correctly perform appropriate HEP for lumbar spine   ? Time 1   ? Period Days   ? Status  Achieved   ? Target Date 12/26/21   ? ?  ?  ? ?  ? ? ? ? PT Long Term Goals - 12/26/21 1754   ? ?  ? PT LONG TERM GOAL #1  ? Title N/A   ? ?  ?  ? ?  ? ? ? ? ? ? ? ? ? Plan - 12/26/21 1751   ? ? Clinical Impression Statement Yanina arrives today doing OK, her back has been bothering her for some time and she now has R shoulder surgery scheduled in less than a week from now. We decided to make today an eval only session, and focused on giving her an extensive HEP that she can use even when in a sling after surgery. I did educate that if she does not see improvement in her low back after her shoulder surgery/rehab, we really should revisit PT for her back pain.   ? Personal Factors and Comorbidities Time since onset of injury/illness/exacerbation;Education;Social Background   ? Examination-Activity Limitations Bend;Sleep;Carry;Squat;Stand;Lift   ? Examination-Participation Restrictions Cleaning;Occupation;Community Activity;Interpersonal Relationship;Shop;Yard Work;Volunteer   ? Stability/Clinical Decision Making Stable/Uncomplicated   ? Clinical Decision Making Low   ? Rehab Potential Fair   ? PT Frequency Other (comment)   eval only  ? PT Duration Other (comment)   eval only  ? PT Treatment/Interventions ADLs/Self Care Home Management;Therapeutic exercise   ? PT Next Visit Plan eval only, she plans to return for PT on her arm after shoulder surgery   ? PT Home Exercise Plan [id number]   ? Consulted and Agree with Plan of Care Patient   ? ?  ?  ? ?  ? ? ?Patient will benefit from skilled therapeutic intervention in order to improve the following deficits and impairments:  Decreased range of motion, Increased fascial restricitons, Increased muscle spasms, Decreased activity tolerance, Pain, Hypomobility, Impaired flexibility, Improper body mechanics, Decreased strength, Postural dysfunction ? ?Visit Diagnosis: ?Chronic bilateral low back pain without sciatica ? ?Muscle weakness (generalized) ? ? ? ? ?Problem  List ?Patient Active Problem List  ? Diagnosis Date Noted  ? Degenerative disc disease, cervical 06/16/2021  ? Somatic dysfunction of spine, thoracic 06/16/2021  ? S/P bilateral salpingo-oophorectomy 01/10/2015  ?  Cl

## 2021-12-30 NOTE — Telephone Encounter (Signed)
Not needed for this surgery upon review thx

## 2021-12-31 NOTE — Telephone Encounter (Signed)
IC advising.  ?

## 2022-01-01 ENCOUNTER — Encounter (HOSPITAL_COMMUNITY)
Admission: RE | Disposition: A | Discharge status: Home / Self Care | Payer: Self-pay | Source: Home / Self Care | Attending: Orthopedic Surgery

## 2022-01-01 ENCOUNTER — Ambulatory Visit (HOSPITAL_COMMUNITY): Payer: BC Managed Care – PPO | Admitting: Anesthesiology

## 2022-01-01 ENCOUNTER — Ambulatory Visit (HOSPITAL_COMMUNITY)
Admission: RE | Admit: 2022-01-01 | Discharge: 2022-01-01 | Disposition: A | Discharge status: Home / Self Care | Payer: BC Managed Care – PPO | Attending: Orthopedic Surgery | Admitting: Orthopedic Surgery

## 2022-01-01 ENCOUNTER — Encounter (HOSPITAL_COMMUNITY): Payer: Self-pay | Admitting: Orthopedic Surgery

## 2022-01-01 ENCOUNTER — Other Ambulatory Visit: Payer: Self-pay | Admitting: Family Medicine

## 2022-01-01 ENCOUNTER — Other Ambulatory Visit: Payer: Self-pay | Admitting: Surgical

## 2022-01-01 ENCOUNTER — Other Ambulatory Visit: Payer: Self-pay

## 2022-01-01 DIAGNOSIS — G4733 Obstructive sleep apnea (adult) (pediatric): Secondary | ICD-10-CM | POA: Diagnosis not present

## 2022-01-01 DIAGNOSIS — S43431A Superior glenoid labrum lesion of right shoulder, initial encounter: Secondary | ICD-10-CM | POA: Diagnosis not present

## 2022-01-01 DIAGNOSIS — Z87891 Personal history of nicotine dependence: Secondary | ICD-10-CM | POA: Diagnosis not present

## 2022-01-01 DIAGNOSIS — G8918 Other acute postprocedural pain: Secondary | ICD-10-CM | POA: Diagnosis not present

## 2022-01-01 DIAGNOSIS — M7521 Bicipital tendinitis, right shoulder: Secondary | ICD-10-CM | POA: Diagnosis not present

## 2022-01-01 DIAGNOSIS — Z01818 Encounter for other preprocedural examination: Secondary | ICD-10-CM

## 2022-01-01 DIAGNOSIS — M4802 Spinal stenosis, cervical region: Secondary | ICD-10-CM | POA: Insufficient documentation

## 2022-01-01 DIAGNOSIS — R519 Headache, unspecified: Secondary | ICD-10-CM | POA: Diagnosis not present

## 2022-01-01 DIAGNOSIS — M19011 Primary osteoarthritis, right shoulder: Secondary | ICD-10-CM

## 2022-01-01 DIAGNOSIS — F419 Anxiety disorder, unspecified: Secondary | ICD-10-CM | POA: Insufficient documentation

## 2022-01-01 DIAGNOSIS — M47812 Spondylosis without myelopathy or radiculopathy, cervical region: Secondary | ICD-10-CM | POA: Diagnosis not present

## 2022-01-01 HISTORY — PX: SHOULDER ARTHROSCOPY WITH OPEN ROTATOR CUFF REPAIR AND DISTAL CLAVICLE ACROMINECTOMY: SHX5683

## 2022-01-01 SURGERY — SHOULDER ARTHROSCOPY WITH OPEN ROTATOR CUFF REPAIR AND DISTAL CLAVICLE ACROMINECTOMY
Anesthesia: General | Laterality: Right

## 2022-01-01 MED ORDER — SODIUM CHLORIDE 0.9 % IR SOLN
Status: DC | PRN
  Administered 2022-01-01 (×2): 3000 mL

## 2022-01-01 MED ORDER — LIDOCAINE 2% (20 MG/ML) 5 ML SYRINGE
INTRAMUSCULAR | Status: DC | PRN
  Administered 2022-01-01: 80 mg via INTRAVENOUS

## 2022-01-01 MED ORDER — LACTATED RINGERS IV SOLN: INTRAVENOUS | Status: DC

## 2022-01-01 MED ORDER — PROPOFOL 10 MG/ML IV BOLUS
INTRAVENOUS | Status: AC
  Filled 2022-01-01: qty 20

## 2022-01-01 MED ORDER — SCOPOLAMINE 1 MG/3DAYS TD PT72
1.0000 | MEDICATED_PATCH | TRANSDERMAL | Status: DC
  Administered 2022-01-01: 1.5 mg via TRANSDERMAL
  Filled 2022-01-01: qty 1

## 2022-01-01 MED ORDER — BUPIVACAINE LIPOSOME 1.3 % IJ SUSP
INTRAMUSCULAR | Status: DC | PRN
  Administered 2022-01-01: 10 mL via PERINEURAL

## 2022-01-01 MED ORDER — OXYCODONE HCL 5 MG/5ML PO SOLN: 5.0000 mg | Freq: Once | ORAL | Status: DC | PRN

## 2022-01-01 MED ORDER — CEFAZOLIN SODIUM-DEXTROSE 2-4 GM/100ML-% IV SOLN
2.0000 g | INTRAVENOUS | Status: AC
Start: 2022-01-02 — End: 2022-01-01
  Administered 2022-01-01: 2 g via INTRAVENOUS
  Filled 2022-01-01: qty 100

## 2022-01-01 MED ORDER — POVIDONE-IODINE 10 % EX SWAB: 2.0000 "application " | Freq: Once | CUTANEOUS | Status: DC

## 2022-01-01 MED ORDER — ONDANSETRON HCL 4 MG/2ML IJ SOLN
INTRAMUSCULAR | Status: DC | PRN
  Administered 2022-01-01: 4 mg via INTRAVENOUS

## 2022-01-01 MED ORDER — TIZANIDINE HCL 4 MG PO TABS: 4.0000 mg | ORAL_TABLET | Freq: Every evening | ORAL | 1 refills | Status: DC | PRN

## 2022-01-01 MED ORDER — PHENYLEPHRINE HCL-NACL 20-0.9 MG/250ML-% IV SOLN
INTRAVENOUS | Status: DC | PRN
  Administered 2022-01-01: 20 ug/min via INTRAVENOUS

## 2022-01-01 MED ORDER — DEXMEDETOMIDINE (PRECEDEX) IN NS 20 MCG/5ML (4 MCG/ML) IV SYRINGE
PREFILLED_SYRINGE | INTRAVENOUS | Status: DC | PRN
  Administered 2022-01-01: 20 ug via INTRAVENOUS

## 2022-01-01 MED ORDER — BUPIVACAINE HCL (PF) 0.5 % IJ SOLN
INTRAMUSCULAR | Status: DC | PRN
  Administered 2022-01-01: 15 mL via PERINEURAL

## 2022-01-01 MED ORDER — FENTANYL CITRATE (PF) 100 MCG/2ML IJ SOLN: 50.0000 ug | Freq: Once | INTRAMUSCULAR | Status: AC

## 2022-01-01 MED ORDER — ONDANSETRON HCL 4 MG/2ML IJ SOLN: 4.0000 mg | Freq: Once | INTRAMUSCULAR | Status: DC | PRN

## 2022-01-01 MED ORDER — MIDAZOLAM HCL 2 MG/2ML IJ SOLN: 2.0000 mg | Freq: Once | INTRAMUSCULAR | Status: AC

## 2022-01-01 MED ORDER — AMISULPRIDE (ANTIEMETIC) 5 MG/2ML IV SOLN
INTRAVENOUS | Status: AC
  Filled 2022-01-01: qty 4

## 2022-01-01 MED ORDER — AMISULPRIDE (ANTIEMETIC) 5 MG/2ML IV SOLN
10.0000 mg | Freq: Once | INTRAVENOUS | Status: AC | PRN
  Administered 2022-01-01: 10 mg via INTRAVENOUS

## 2022-01-01 MED ORDER — SUGAMMADEX SODIUM 200 MG/2ML IV SOLN
INTRAVENOUS | Status: DC | PRN
Start: 2022-01-01 — End: 2022-01-01
  Administered 2022-01-01: 317.6 mg via INTRAVENOUS

## 2022-01-01 MED ORDER — OXYCODONE HCL 5 MG PO TABS: 5.0000 mg | ORAL_TABLET | Freq: Once | ORAL | Status: DC | PRN

## 2022-01-01 MED ORDER — ROCURONIUM BROMIDE 10 MG/ML (PF) SYRINGE
PREFILLED_SYRINGE | INTRAVENOUS | Status: DC | PRN
Start: 2022-01-01 — End: 2022-01-01
  Administered 2022-01-01: 80 mg via INTRAVENOUS
  Administered 2022-01-01: 20 mg via INTRAVENOUS

## 2022-01-01 MED ORDER — EPINEPHRINE PF 1 MG/ML IJ SOLN
INTRAMUSCULAR | Status: DC | PRN
  Administered 2022-01-01 (×2): 1 mg

## 2022-01-01 MED ORDER — POVIDONE-IODINE 7.5 % EX SOLN: Freq: Once | CUTANEOUS | Status: DC

## 2022-01-01 MED ORDER — PROPOFOL 500 MG/50ML IV EMUL
INTRAVENOUS | Status: DC | PRN
  Administered 2022-01-01: 25 ug/kg/min via INTRAVENOUS

## 2022-01-01 MED ORDER — DEXAMETHASONE SODIUM PHOSPHATE 10 MG/ML IJ SOLN
INTRAMUSCULAR | Status: DC | PRN
  Administered 2022-01-01: 10 mg via INTRAVENOUS

## 2022-01-01 MED ORDER — MIDAZOLAM HCL 2 MG/2ML IJ SOLN
INTRAMUSCULAR | Status: AC
  Administered 2022-01-01: 2 mg via INTRAVENOUS
  Filled 2022-01-01: qty 2

## 2022-01-01 MED ORDER — EPINEPHRINE PF 1 MG/ML IJ SOLN
INTRAMUSCULAR | Status: AC
  Filled 2022-01-01: qty 3

## 2022-01-01 MED ORDER — FENTANYL CITRATE (PF) 100 MCG/2ML IJ SOLN
INTRAMUSCULAR | Status: AC
  Administered 2022-01-01: 50 ug via INTRAVENOUS
  Filled 2022-01-01: qty 2

## 2022-01-01 MED ORDER — ACETAMINOPHEN 500 MG PO TABS
1000.0000 mg | ORAL_TABLET | Freq: Once | ORAL | Status: AC
  Administered 2022-01-01: 1000 mg via ORAL
  Filled 2022-01-01: qty 2

## 2022-01-01 MED ORDER — OXYCODONE-ACETAMINOPHEN 5-325 MG PO TABS: 1.0000 | ORAL_TABLET | ORAL | 0 refills | Status: DC | PRN

## 2022-01-01 MED ORDER — FENTANYL CITRATE (PF) 100 MCG/2ML IJ SOLN: 25.0000 ug | INTRAMUSCULAR | Status: DC | PRN

## 2022-01-01 MED ORDER — CHLORHEXIDINE GLUCONATE 0.12 % MT SOLN
15.0000 mL | Freq: Once | OROMUCOSAL | Status: AC
  Administered 2022-01-01: 15 mL via OROMUCOSAL
  Filled 2022-01-01: qty 15

## 2022-01-01 MED ORDER — PROPOFOL 10 MG/ML IV BOLUS
INTRAVENOUS | Status: DC | PRN
  Administered 2022-01-01: 150 mg via INTRAVENOUS

## 2022-01-01 MED ORDER — ORAL CARE MOUTH RINSE: 15.0000 mL | Freq: Once | OROMUCOSAL | Status: AC

## 2022-01-01 SURGICAL SUPPLY — 62 items
BAG COUNTER SPONGE SURGICOUNT (BAG) ×2 IMPLANT
BLADE EXCALIBUR 4.0X13 (MISCELLANEOUS) IMPLANT
BLADE SURG 11 STRL SS (BLADE) ×2 IMPLANT
BUR OVAL 6.0 (BURR) IMPLANT
COVER SURGICAL LIGHT HANDLE (MISCELLANEOUS) ×2 IMPLANT
DRAPE INCISE IOBAN 66X45 STRL (DRAPES) ×4 IMPLANT
DRAPE STERI 35X30 U-POUCH (DRAPES) ×2 IMPLANT
DRAPE U-SHAPE 47X51 STRL (DRAPES) ×4 IMPLANT
DRSG PAD ABDOMINAL 8X10 ST (GAUZE/BANDAGES/DRESSINGS) ×6 IMPLANT
DRSG TEGADERM 4X4.75 (GAUZE/BANDAGES/DRESSINGS) ×8 IMPLANT
DURAPREP 26ML APPLICATOR (WOUND CARE) ×2 IMPLANT
ELECT REM PT RETURN 9FT ADLT (ELECTROSURGICAL) ×2
ELECTRODE REM PT RTRN 9FT ADLT (ELECTROSURGICAL) ×1 IMPLANT
FILTER STRAW FLUID ASPIR (MISCELLANEOUS) ×2 IMPLANT
GAUZE SPONGE 4X4 12PLY STRL LF (GAUZE/BANDAGES/DRESSINGS) ×2 IMPLANT
GAUZE XEROFORM 1X8 LF (GAUZE/BANDAGES/DRESSINGS) ×2 IMPLANT
GLOVE SRG 8 PF TXTR STRL LF DI (GLOVE) ×1 IMPLANT
GLOVE SURG LTX SZ8 (GLOVE) ×2 IMPLANT
GLOVE SURG UNDER POLY LF SZ8 (GLOVE) ×2
GOWN STRL REUS W/ TWL LRG LVL3 (GOWN DISPOSABLE) ×3 IMPLANT
GOWN STRL REUS W/TWL LRG LVL3 (GOWN DISPOSABLE) ×6
KIT BASIN OR (CUSTOM PROCEDURE TRAY) ×2 IMPLANT
KIT TURNOVER KIT B (KITS) ×2 IMPLANT
MANIFOLD NEPTUNE II (INSTRUMENTS) ×2 IMPLANT
NDL HYPO 25X1 1.5 SAFETY (NEEDLE) ×1 IMPLANT
NDL SCORPION MULTI FIRE (NEEDLE) IMPLANT
NDL SPNL 18GX3.5 QUINCKE PK (NEEDLE) ×1 IMPLANT
NDL SUT 6 .5 CRC .975X.05 MAYO (NEEDLE) IMPLANT
NEEDLE HYPO 25X1 1.5 SAFETY (NEEDLE) ×2 IMPLANT
NEEDLE MAYO TAPER (NEEDLE)
NEEDLE SCORPION MULTI FIRE (NEEDLE) IMPLANT
NEEDLE SPNL 18GX3.5 QUINCKE PK (NEEDLE) ×2 IMPLANT
NS IRRIG 1000ML POUR BTL (IV SOLUTION) ×2 IMPLANT
PACK SHOULDER (CUSTOM PROCEDURE TRAY) ×2 IMPLANT
PAD ARMBOARD 7.5X6 YLW CONV (MISCELLANEOUS) ×4 IMPLANT
PORT APPOLLO RF 90DEGREE MULTI (SURGICAL WAND) ×1 IMPLANT
RESTRAINT HEAD UNIVERSAL NS (MISCELLANEOUS) ×2 IMPLANT
SLING ARM IMMOBILIZER MED (SOFTGOODS) IMPLANT
SPONGE T-LAP 4X18 ~~LOC~~+RFID (SPONGE) ×4 IMPLANT
STRIP CLOSURE SKIN 1/2X4 (GAUZE/BANDAGES/DRESSINGS) ×2 IMPLANT
SUCTION FRAZIER HANDLE 10FR (MISCELLANEOUS) ×2
SUCTION TUBE FRAZIER 10FR DISP (MISCELLANEOUS) ×1 IMPLANT
SUT ETHILON 3 0 PS 1 (SUTURE) ×2 IMPLANT
SUT FIBERWIRE #2 38 T-5 BLUE (SUTURE)
SUT MNCRL AB 3-0 PS2 18 (SUTURE) ×2 IMPLANT
SUT VIC AB 0 CT1 27 (SUTURE) ×2
SUT VIC AB 0 CT1 27XBRD ANBCTR (SUTURE) ×1 IMPLANT
SUT VIC AB 1 CT1 27 (SUTURE) ×2
SUT VIC AB 1 CT1 27XBRD ANBCTR (SUTURE) ×1 IMPLANT
SUT VIC AB 2-0 CT1 27 (SUTURE) ×2
SUT VIC AB 2-0 CT1 TAPERPNT 27 (SUTURE) ×1 IMPLANT
SUT VICRYL 0 UR6 27IN ABS (SUTURE) IMPLANT
SUTURE FIBERWR #2 38 T-5 BLUE (SUTURE) IMPLANT
SYR 20ML LL LF (SYRINGE) ×4 IMPLANT
SYR 3ML LL SCALE MARK (SYRINGE) ×2 IMPLANT
SYR TB 1ML LUER SLIP (SYRINGE) ×2 IMPLANT
SYS FBRTK BUTTON 2.6 (Anchor) ×2 IMPLANT
SYSTEM FBRTK BUTTON 2.6 (Anchor) IMPLANT
TOWEL GREEN STERILE (TOWEL DISPOSABLE) ×2 IMPLANT
TOWEL GREEN STERILE FF (TOWEL DISPOSABLE) ×2 IMPLANT
TUBING ARTHROSCOPY IRRIG 16FT (MISCELLANEOUS) ×2 IMPLANT
WATER STERILE IRR 1000ML POUR (IV SOLUTION) ×2 IMPLANT

## 2022-01-01 NOTE — Brief Op Note (Signed)
? ?  01/01/2022 ? ?5:27 PM ? ?PATIENT:  Kayla Daugherty  61 y.o. female ? ?PRE-OPERATIVE DIAGNOSIS:  right shoulder acromioclavicular osteoarthritis, possible slap tear ? ?POST-OPERATIVE DIAGNOSIS:  right shoulder acromioclavicular osteoarthritis, slap tear ? ?PROCEDURE:  Procedure(s): ?RIGHT SHOULDER ARTHROSCOPY, SUBACROMIAL DECOMPRESSION, DISTAL CLAVICLE EXCISION,  BICEP TENODESIS ? ?SURGEON:  Surgeon(s): ?Cammy Copa, MD ? ?ASSISTANT: magnant pa ? ?ANESTHESIA:   general ? ?EBL: 25 ml   ? ?Total I/O ?In: 800 [I.V.:800] ?Out: 20 [Blood:20] ? ?BLOOD ADMINISTERED: none ? ?DRAINS: none  ? ?LOCAL MEDICATIONS USED:  none ? ?SPECIMEN:  No Specimen ? ?COUNTS:  YES ? ?TOURNIQUET:  * No tourniquets in log * ? ?DICTATION: .Other Dictation: Dictation Number [id number] ? ?PLAN OF CARE: Discharge to home after PACU ? ?PATIENT DISPOSITION:  PACU - hemodynamically stable ? ? ? ? ? ? ? ? ? ? ? ? ?  ?

## 2022-01-01 NOTE — H&P (Signed)
Kayla Daugherty is an 61 y.o. female.   ?Chief Complaint: right shoulder pain ?HPI: Kayla Daugherty is a 61 year old patient with right shoulder pain.  She had an AC joint injection 10/29/2021.  She did get about 50% relief of her shoulder pain for a month.  The pain still wakes her from sleep at night.  Reports a pulling sensation.  Denies any numbness and tingling in the right upper extremity.  She has occasional radiation of the pain to the thumb.  She did not get immediate relief in the 1 to 2 hours after the injection but did take 4 to 5 days for her to start feeling better with the shot.  She uses a pillow under her arm.  She cannot play golf secondary to her neck.  She has a backpack type trip in August planned where she will be going to [place].  She has had symptoms in the shoulder for 2 years.  MRI of the cervical spine shows left-sided facet arthropathy at C2-3 and mild to moderate by foraminal stenosis most pronounced on the left at C5-6.  MRI scan of the shoulder demonstrates arthropathy of the [facility] joint with no significant subacromial fluid.  Labrum intact and rotator cuff intact nonarthrogram MRI shoulder was performed ? ?Past Medical History:  ?Diagnosis Date  ? Anxiety   ? Arthritis   ? Family history of ovarian carcinoma   ? High risk of ovarian cancer   ? Hyperlipidemia   ? Hypertension   ? Migraines   ? Mild obstructive sleep apnea   ? study done Sept 2015--  mild osa per pt in process being fitted for cpap  ? PONV (postoperative nausea and vomiting)   ? Wears glasses   ? Yeast dermatitis   ? abdomin-  comes and goes  ? ? ?Past Surgical History:  ?Procedure Laterality Date  ? COLONOSCOPY  2013  ? CYSTOSCOPY  01/10/2015  ? Procedure: CYSTOSCOPY;  Surgeon: Richardean Chimera, MD;  Location: [facility];  Service: Gynecology;;  ? LAPAROSCOPIC SALPINGO OOPHERECTOMY Bilateral 01/10/2015  ? Procedure: LAPAROSCOPIC SALPINGO OOPHORECTOMY;  Surgeon: Richardean Chimera, MD;  Location: [facility];   Service: Gynecology;  Laterality: Bilateral;  ? TONSILLECTOMY  as child  ? ? ?History reviewed. No pertinent family history. ?Social History:  reports that she quit smoking about 17 years ago. Her smoking use included cigarettes. She has never used smokeless tobacco. She reports current alcohol use. She reports that she does not use drugs. ? ?Allergies:  ?Allergies  ?Allergen Reactions  ? Bee Venom Swelling  ? Latex Other (See Comments)  ?  "reddness"  ? Lisinopril Cough  ? Simvastatin   ?  joint pain  ? Tetracycline Nausea And Vomiting  ? ? ?Medications Prior to Admission  ?Medication Sig Dispense Refill  ? acetaminophen (TYLENOL) 500 MG tablet Take 1,000 mg by mouth every 6 (six) hours as needed for moderate pain.    ? celecoxib (CELEBREX) 100 MG capsule Take 1 capsule (100 mg total) by mouth 2 (two) times daily as needed. 60 capsule 3  ? Cholecalciferol (DIALYVITE VITAMIN D 5000) 125 MCG (5000 UT) capsule Take 10,000 Units by mouth daily.    ? clonazePAM (KLONOPIN) 1 MG tablet Take 1-2 mg by mouth at bedtime.    ? Cyanocobalamin (B-12 PO) Take 1 capsule by mouth daily.    ? EPINEPHrine 0.3 mg/0.3 mL IJ SOAJ injection Inject 0.3 mg into the muscle as needed for anaphylaxis.    ?  finasteride (PROPECIA) 1 MG tablet Take 1 mg by mouth daily.    ? gabapentin (NEURONTIN) 300 MG capsule Take 1 capsule (300 mg total) by mouth 3 (three) times daily. 90 capsule 2  ? hydrocortisone valerate cream (WESTCORT) 0.2 % Apply 1 application. topically daily. Apply in both ears daily    ? LIVALO 2 MG TABS Take 2 mg by mouth daily.    ? losartan-hydrochlorothiazide (HYZAAR) 50-12.5 MG tablet Take 1 tablet by mouth daily.    ? Multiple Vitamin (MULTIVITAMIN WITH MINERALS) TABS tablet Take 1 tablet by mouth daily.    ? NP THYROID 90 MG tablet Take 90 mg by mouth every morning.    ? rizatriptan (MAXALT) 10 MG tablet Take 10 mg by mouth daily as needed for migraine.    ? sertraline (ZOLOFT) 50 MG tablet Take 50 mg by mouth at bedtime.     ? tiZANidine (ZANAFLEX) 4 MG tablet Take 1 tablet (4 mg total) by mouth at bedtime as needed for muscle spasms. (Patient taking differently: Take 4 mg by mouth at bedtime.) 30 tablet 1  ? topiramate ER (QUDEXY XR) 25 MG CS24 sprinkle cap Take 25 mg by mouth at bedtime.    ? zolpidem (AMBIEN) 10 MG tablet Take 10 mg by mouth at bedtime.    ? ? ?No results found for this or any previous visit (from the past 48 hour(s)). ?No results found. ? ?Review of Systems  ?Musculoskeletal:  Positive for arthralgias.  ?All other systems reviewed and are negative. ? ?Blood pressure 125/75, pulse 85, temperature 98.4 ?F (36.9 ?C), temperature source Oral, resp. rate 18, height 5\' 6"  (1.676 m), weight 79.4 kg, last menstrual period 04/26/2014, SpO2 98 %. ?Physical Exam ?Vitals reviewed.  ?HENT:  ?   Head: Normocephalic.  ?   Mouth/Throat:  ?   Mouth: Mucous membranes are moist.  ?Eyes:  ?   Pupils: Pupils are equal, round, and reactive to light.  ?Cardiovascular:  ?   Rate and Rhythm: Normal rate.  ?   Pulses: Normal pulses.  ?Pulmonary:  ?   Effort: Pulmonary effort is normal.  ?Abdominal:  ?   General: Abdomen is flat.  ?Musculoskeletal:  ?   Cervical back: Normal range of motion.  ?Skin: ?   General: Skin is warm.  ?   Capillary Refill: Capillary refill takes less than 2 seconds.  ?Neurological:  ?   General: No focal deficit present.  ?   Mental Status: She is alert.  ?Psychiatric:     ?   Mood and Affect: Mood normal.  ?  ?Ortho exam demonstrates tenderness to palpation of the [facility] joint right versus left.  There are some pain with crossarm adduction on the right versus left.  Passive shoulder range of motion bilaterally is 65/95/175.  Rotator cuff strength excellent infraspinatus supraspinatus subscap muscle testing on the right-hand side with equivocal O'Brien's testing on the right negative on the left.  She does have a little crepitus with internal/external rotation of the right arm which localizes to the [facility]  joint ?Assessment/Plan ?Impression is right-sided shoulder pain which appears to be localizing on exam and radiographically to the [facility] joint.  Structurally the shoulder otherwise appears intact.  In this nonarthrogram study it is possible that she may also have biceps and labral pathology superiorly.  That can be addressed at the time of arthroscopic shoulder joint evaluation.  Plan at this time is for shoulder arthroscopy with evaluation of the superior labrum and possible  biceps tenodesis with arthroscopic distal clavicle excision.  The risk and benefits of the procedure are discussed with the patient including but limited to infection nerve vessel damage incomplete pain relief as well as shoulder stiffness.  Rehabilitation plan discussed.  All questions answered.  I think it is possible she may have some symptoms coming from the neck but based on duration of symptoms as well as improvement with AC joint injection I think it would be reasonable to proceed with arthroscopic intervention in the shoulder at this time. ? ?Burnard BuntingG Scott Maryellen Dowdle, MD ?01/01/2022, 1:56 PM ? ? ? ?

## 2022-01-01 NOTE — Anesthesia Preprocedure Evaluation (Addendum)
Anesthesia Evaluation  ?Patient identified by MRN, date of birth, ID band ?Patient awake ? ? ? ?Reviewed: ?Allergy & Precautions, NPO status , Patient's Chart, lab work & pertinent test results ? ?History of Anesthesia Complications ?(+) PONV and history of anesthetic complications ? ?Airway ?Mallampati: III ? ?TM Distance: >3 FB ?Neck ROM: Full ? ? ?Comment: Narrow palate Dental ?no notable dental hx. ? ?  ?Pulmonary ?sleep apnea and Continuous Positive Airway Pressure Ventilation , former smoker,  ?  ?Pulmonary exam normal ?breath sounds clear to auscultation ? ? ? ? ? ? Cardiovascular ?hypertension, Pt. on medications ?negative cardio ROS ?Normal cardiovascular exam ?Rhythm:Regular Rate:Normal ? ? ?  ?Neuro/Psych ? Headaches, PSYCHIATRIC DISORDERS Anxiety   ? GI/Hepatic ?negative GI ROS, Neg liver ROS,   ?Endo/Other  ?negative endocrine ROS ? Renal/GU ?negative Renal ROS  ?negative genitourinary ?  ?Musculoskeletal ? ?(+) Arthritis ,  ? Abdominal ?  ?Peds ?negative pediatric ROS ?(+)  Hematology ?negative hematology ROS ?(+)   ?Anesthesia Other Findings ? ? Reproductive/Obstetrics ?negative OB ROS ? ?  ? ? ? ? ? ? ? ? ? ? ? ? ? ?  ?  ? ? ? ? ? ? ? ?Anesthesia Physical ?Anesthesia Plan ? ?ASA: 2 ? ?Anesthesia Plan: General  ? ?Post-op Pain Management: Minimal or no pain anticipated, Tylenol PO (pre-op)* and Regional block*  ? ?Induction: Intravenous ? ?PONV Risk Score and Plan: 4 or greater and Treatment may vary due to age or medical condition, Midazolam, Scopolamine patch - Pre-op, Ondansetron and Dexamethasone ? ?Airway Management Planned: Oral ETT ? ?Additional Equipment:  ? ?Intra-op Plan:  ? ?Post-operative Plan: Extubation in OR ? ?Informed Consent: I have reviewed the patients History and Physical, chart, labs and discussed the procedure including the risks, benefits and alternatives for the proposed anesthesia with the patient or authorized representative who has  indicated his/her understanding and acceptance.  ? ? ? ?Dental advisory given ? ?Plan Discussed with: CRNA, Anesthesiologist and Surgeon ? ?Anesthesia Plan Comments:   ? ? ? ? ? ? ?Anesthesia Quick Evaluation ? ?

## 2022-01-01 NOTE — Anesthesia Procedure Notes (Signed)
Procedure Name: Intubation ?Date/Time: 01/01/2022 3:05 PM ?Performed by: Minerva Ends, CRNA ?Pre-anesthesia Checklist: Patient identified, Emergency Drugs available, Suction available and Patient being monitored ?Patient Re-evaluated:Patient Re-evaluated prior to induction ?Oxygen Delivery Method: Circle system utilized ?Preoxygenation: Pre-oxygenation with 100% oxygen ?Induction Type: IV induction ?Ventilation: Mask ventilation without difficulty ?Laryngoscope Size: Mac and 3 ?Grade View: Grade III ?Tube type: Oral ?Tube size: 7.0 mm ?Number of attempts: 2 ?Airway Equipment and Method: Oral airway and Bougie stylet ?Placement Confirmation: ETT inserted through vocal cords under direct vision, positive ETCO2 and breath sounds checked- equal and bilateral ?Secured at: 22 cm ?Tube secured with: Tape ?Dental Injury: Teeth and Oropharynx as per pre-operative assessment  ?Difficulty Due To: Difficulty was anticipated, Difficult Airway- due to anterior larynx and Difficult Airway- due to limited oral opening ? ? ? ? ?

## 2022-01-01 NOTE — Transfer of Care (Signed)
Immediate Anesthesia Transfer of Care Note ? ?Patient: Kayla Daugherty ? ?Procedure(s) Performed: RIGHT SHOULDER ARTHROSCOPY, SUBACROMIAL DECOMPRESSION, DISTAL CLAVICLE EXCISION,  BICEP TENODESIS (Right) ? ?Patient Location: PACU ? ?Anesthesia Type:GA combined with regional for post-op pain ? ?Level of Consciousness: awake ? ?Airway & Oxygen Therapy: Patient Spontanous Breathing ? ?Post-op Assessment: Report given to RN and Post -op Vital signs reviewed and stable ? ?Post vital signs: Reviewed and stable ? ?Last Vitals:  ?Vitals Value Taken Time  ?BP 117/95 01/01/22 1725  ?Temp 36.1 ?C 01/01/22 1725  ?Pulse 59 01/01/22 1736  ?Resp 16 01/01/22 1736  ?SpO2 94 % 01/01/22 1736  ?Vitals shown include unvalidated device data. ? ?Last Pain:  ?Vitals:  ? 01/01/22 1725  ?TempSrc:   ?PainSc: Asleep  ?   ? ?Patients Stated Pain Goal: 0 (01/01/22 1222) ? ?Complications: No notable events documented. ?

## 2022-01-01 NOTE — Anesthesia Procedure Notes (Signed)
Anesthesia Regional Block: Interscalene brachial plexus block  ? ?Pre-Anesthetic Checklist: , timeout performed,  Correct Patient, Correct Site, Correct Laterality,  Correct Procedure, Correct Position, site marked,  Risks and benefits discussed,  Surgical consent,  Pre-op evaluation,  At surgeon's request and post-op pain management ? ?Laterality: Right ? ?Prep: chloraprep     ?  ?Needles:  ?Injection technique: Single-shot ? ?Needle Type: Echogenic Stimulator Needle   ? ? ?Needle Length: 10cm  ?Needle Gauge: 20  ? ? ? ?Additional Needles: ? ? ?Procedures:,,,, ultrasound used (permanent image in chart),,    ?Narrative:  ?Start time: 01/01/2022 12:45 PM ?End time: 01/01/2022 12:50 PM ?Injection made incrementally with aspirations every 5 mL. ? ?Performed by: Personally  ?Anesthesiologist: Mellody Dance, MD ? ?Additional Notes: ?Standard monitors applied. Skin prepped. Good needle visualization with ultrasound. Injection made in 5cc increments with no resistance to injection. Patient tolerated the procedure well. ? ? ? ? ?

## 2022-01-01 NOTE — Op Note (Signed)
NAME: Kayla Daugherty, Kayla Daugherty ?MEDICAL RECORD NO: [id number] ?ACCOUNT NO: [id number] ?DATE OF BIRTH: 01-27-1961 ?FACILITY: [facility] ?LOCATION: [facility] ?PHYSICIAN: Graylin Shiver. August Saucer, MD ? ?Operative Report  ? ?DATE OF PROCEDURE: 01/01/2022 ? ?PREOPERATIVE DIAGNOSIS:  Right shoulder acromioclavicular joint arthritis. ? ?POSTOPERATIVE DIAGNOSES: Right shoulder acromioclavicular joint arthritis and degenerative SLAP tear. ? ?PROCEDURES:  Right shoulder arthroscopy with limited debridement of the superior labrum with biceps tendon release, arthroscopic distal clavicle excision and mini open biceps tenodesis. ? ?SURGEON:  Graylin Shiver. August Saucer, MD ? ?ASSISTANT:  Karenann Cai, PA ? ?INDICATIONS:  The patient is a 61 year old female with right shoulder pain, which has failed conservative management.  MRI scan shows significant edema within the distal clavicle.  She presents now for operative management after explanation of risks and  ?benefits. ? ?DESCRIPTION OF PROCEDURE:  The patient was brought to the operating room where general anesthetic was induced.  Preoperative antibiotics were administered.  Timeout was called.  The patient was placed in the beach chair position with the head in neutral  ?position.  Right arm, shoulder and hand prescrubbed with alcohol and Betadine, allowed to air dry, prepped  with DuraPrep solution and draped in a sterile manner.  Ioban used to seal the operative field and cover the axilla.  Timeout was called.   ?Posterior portal created 2 cm medial and inferior to the posterolateral margin of acromion.  Diagnostic arthroscopy was performed.  Anterior portal created in line with the distal clavicle.  The biceps tendon was inspected.  Significant degeneration of  ?the superior labrum was present.  Debridement was performed and there was not too much healthy tissue around the biceps anchor for stabilization.  Decision was made at that time to release the biceps tendon and debride the superior labrum, Articular   ?surfaces were intact.  Rotator cuff was intact.  Anterior inferior and posterior inferior glenohumeral ligaments intact.  Following limited debridement and release of the biceps tendon, scope was placed in the subacromial space.  Arthrocare wand was used ? through a lateral portal to perform bursectomy as well as remove soft tissue from the distal end of the clavicle.  A bur was then utilized to resect about 9 mm of the distal end of the clavicle, maintaining the posterior and superior ligaments.   ?Visualization from the anterior portal confirmed good decompression of the [facility] joint.  Following this, the instruments were removed.  Portals were closed using 3-0 nylon.  Ioban then used to cover the operative field.  Incision made off the anterolateral  ?margin of the acromion.  Skin and subcutaneous tissue were sharply divided.  Incision was about 3.5 cm.  A deltoid was tagged about 3.5 cm from the anterolateral margin of the acromion.  Deltoid was split. The biceps tendon was then tenodesed in the  ?bicipital groove under appropriate tension using knotless SutureAnchor distally and knotless SutureTak proximally. We did put a Fiberlock suture in the tendon to facilitate proximal fixation with the dual-loaded knotless suture mechanism.  The tendon was ? oversewn using 0 Vicryl suture x 3.  Thorough irrigation was performed.  Deltoid split was repaired using #1 Vicryl suture followed by interrupted inverted 0 Vicryl suture, 2-0 Vicryl suture, and 3-0 Monocryl with Steri-Strips and impervious dressings  ?and a shoulder sling applied.  ? ?Luke's assistance was required at all times for opening, closing, limb positioning, camera management as well as tissue management and retraction.  His assistance was a medical necessity at all times during the case. ? ? ?  [facility] ?D: 01/01/2022 5:36:46 pm T: 01/01/2022 11:19:00 pm  ?JOB: [id number]/ [id number]  ?

## 2022-01-02 ENCOUNTER — Encounter (HOSPITAL_COMMUNITY): Payer: Self-pay | Admitting: Orthopedic Surgery

## 2022-01-03 DIAGNOSIS — S43431A Superior glenoid labrum lesion of right shoulder, initial encounter: Secondary | ICD-10-CM

## 2022-01-03 DIAGNOSIS — M19011 Primary osteoarthritis, right shoulder: Secondary | ICD-10-CM

## 2022-01-03 DIAGNOSIS — M7521 Bicipital tendinitis, right shoulder: Secondary | ICD-10-CM

## 2022-01-03 NOTE — Anesthesia Postprocedure Evaluation (Signed)
Anesthesia Post Note ? ?Patient: LISHA VITALE ? ?Procedure(s) Performed: RIGHT SHOULDER ARTHROSCOPY, SUBACROMIAL DECOMPRESSION, DISTAL CLAVICLE EXCISION,  BICEP TENODESIS (Right) ? ?  ? ?Patient location during evaluation: PACU ?Anesthesia Type: General ?Level of consciousness: awake and alert ?Pain management: pain level controlled ?Vital Signs Assessment: post-procedure vital signs reviewed and stable ?Respiratory status: spontaneous breathing, nonlabored ventilation, respiratory function stable and patient connected to nasal cannula oxygen ?Cardiovascular status: blood pressure returned to baseline and stable ?Postop Assessment: no apparent nausea or vomiting ?Anesthetic complications: no ? ? ?No notable events documented. ? ?Last Vitals:  ?Vitals:  ? 01/01/22 1755 01/01/22 1810  ?BP: 101/66 106/79  ?Pulse: (!) 51 (!) 50  ?Resp: 11 (!) 22  ?Temp:  (!) 36.1 ?C  ?SpO2: 93% 99%  ?  ?Last Pain:  ?Vitals:  ? 01/01/22 1810  ?TempSrc:   ?PainSc: 0-No pain  ? ? ?  ?  ?  ?  ?  ?  ? ?Marcene Duos E ? ? ? ? ?

## 2022-01-07 DIAGNOSIS — E782 Mixed hyperlipidemia: Secondary | ICD-10-CM | POA: Diagnosis not present

## 2022-01-07 DIAGNOSIS — I10 Essential (primary) hypertension: Secondary | ICD-10-CM | POA: Diagnosis not present

## 2022-01-07 DIAGNOSIS — R7303 Prediabetes: Secondary | ICD-10-CM | POA: Diagnosis not present

## 2022-01-07 DIAGNOSIS — D72829 Elevated white blood cell count, unspecified: Secondary | ICD-10-CM | POA: Diagnosis not present

## 2022-01-07 DIAGNOSIS — Z0000 Encounter for general adult medical examination without abnormal findings: Secondary | ICD-10-CM | POA: Diagnosis not present

## 2022-01-09 ENCOUNTER — Ambulatory Visit (INDEPENDENT_AMBULATORY_CARE_PROVIDER_SITE_OTHER): Payer: BC Managed Care – PPO | Admitting: Surgical

## 2022-01-09 DIAGNOSIS — M19011 Primary osteoarthritis, right shoulder: Secondary | ICD-10-CM

## 2022-01-10 MED ORDER — OXYCODONE-ACETAMINOPHEN 5-325 MG PO TABS: 1.0000 | ORAL_TABLET | Freq: Four times a day (QID) | ORAL | 0 refills | Status: AC | PRN

## 2022-01-13 ENCOUNTER — Encounter: Payer: Self-pay | Admitting: Orthopedic Surgery

## 2022-01-13 NOTE — Progress Notes (Signed)
? ?Post-Op Visit Note ?  ?Patient: Kayla Daugherty           ?Date of Birth: 12/18/1960           ?MRN: [id number] ?Visit Date: 01/09/2022 ?PCP: Lupita Raider, MD ? ? ?Assessment & Plan: ? ?Chief Complaint:  ?Chief Complaint  ?Patient presents with  ? Right Shoulder - Routine Post Op  ?  01/01/22  Right shoulder arthroscopy with limited debridement of the superior labrum with biceps tendon release, arthroscopic distal clavicle excision and mini open biceps tenodesis.  ? ?Visit Diagnoses:  ?1. Arthritis of right acromioclavicular joint   ? ? ?Plan: Patient is a 61 year old female who presents s/p right shoulder biceps tenodesis with distal clavicle excision on 01/01/2022.  Patient states that she is doing well overall.  Pain has been a little worse in the last 2 to 3 days as her block is fully worn off.  Describes pain in the lateral shoulder that radiates to her bicep.  She has been taking oxycodone for pain control but this has ran out.  She request refill.  Refill provided. ? ?Denies any fevers, chills, night sweats, chest pain, shortness of breath, abdominal pain.  She is having regular bowel movements without difficulty.  Block has fully worn off at this point. ? ?On exam, 30 degrees external rotation, 90 degrees abduction, 150 degrees forward flexion.  Incisions are healing well.  Sutures removed and replaced with Steri-Strips.  Axillary nerve intact with deltoid firing.  Intact EPL, FPL, wrist extension, finger abduction, finger adduction, pronation/supination, bicep, tricep, deltoid. ? ?Plan is okay for active and passive range of motion of the right shoulder.  Avoid shoulder extension.  No lifting with the operative arm more than 1 to 2 pounds.  Follow-up with Dr. August Saucer for clinical recheck in 4 weeks.  She will start active and passive shoulder range of motion exercises at home.  Exercise sheet provided. ? ?Follow-Up Instructions: No follow-ups on file.  ? ?Orders:  ?No orders of the defined types were  placed in this encounter. ? ?Meds ordered this encounter  ?Medications  ? oxyCODONE-acetaminophen (PERCOCET) 5-325 MG tablet  ?  Sig: Take 1 tablet by mouth every 6 (six) hours as needed for severe pain. Do not take with Ambien or Klonopin  ?  Dispense:  30 tablet  ?  Refill:  0  ? ? ?Imaging: ?No results found. ? ?PMFS History: ?Patient Active Problem List  ? Diagnosis Date Noted  ? Degenerative superior labral anterior-to-posterior (SLAP) tear of right shoulder   ? Biceps tendonitis on right   ? Arthritis of right acromioclavicular joint   ? Degenerative disc disease, cervical 06/16/2021  ? Somatic dysfunction of spine, thoracic 06/16/2021  ? S/P bilateral salpingo-oophorectomy 01/10/2015  ?  Class: Status post  ? S/P cystoscopy 01/10/2015  ?  Class: Status post  ? METATARSALGIA 09/12/2010  ? FOOT PAIN, RIGHT 09/12/2010  ? ?Past Medical History:  ?Diagnosis Date  ? Anxiety   ? Arthritis   ? Family history of ovarian carcinoma   ? High risk of ovarian cancer   ? Hyperlipidemia   ? Hypertension   ? Migraines   ? Mild obstructive sleep apnea   ? study done Sept 2015--  mild osa per pt in process being fitted for cpap  ? PONV (postoperative nausea and vomiting)   ? Wears glasses   ? Yeast dermatitis   ? abdomin-  comes and goes  ?  ?No family history on  file.  ?Past Surgical History:  ?Procedure Laterality Date  ? COLONOSCOPY  2013  ? CYSTOSCOPY  01/10/2015  ? Procedure: CYSTOSCOPY;  Surgeon: Richardean Chimera, MD;  Location: [facility];  Service: Gynecology;;  ? LAPAROSCOPIC SALPINGO OOPHERECTOMY Bilateral 01/10/2015  ? Procedure: LAPAROSCOPIC SALPINGO OOPHORECTOMY;  Surgeon: Richardean Chimera, MD;  Location: [facility];  Service: Gynecology;  Laterality: Bilateral;  ? SHOULDER ARTHROSCOPY WITH OPEN ROTATOR CUFF REPAIR AND DISTAL CLAVICLE ACROMINECTOMY Right 01/01/2022  ? Procedure: RIGHT SHOULDER ARTHROSCOPY, SUBACROMIAL DECOMPRESSION, DISTAL CLAVICLE EXCISION,  BICEP TENODESIS;  Surgeon: Cammy Copa, MD;  Location: [facility];  Service: Orthopedics;  Laterality: Right;  ? TONSILLECTOMY  as child  ? ?Social History  ? ?Occupational History  ? Not on file  ?Tobacco Use  ? Smoking status: Former  ?  Years: 15.00  ?  Types: Cigarettes  ?  Quit date: 01/03/2005  ?  Years since quitting: 17.0  ? Smokeless tobacco: Never  ?Vaping Use  ? Vaping Use: Never used  ?Substance and Sexual Activity  ? Alcohol use: Yes  ?  Comment: social- rarely  ? Drug use: No  ? Sexual activity: Not on file  ? ? ? ?

## 2022-01-16 IMAGING — XA DG INJECT/[PERSON_NAME] INC NEEDLE/CATH/PLC EPI/CERV/THOR W/IMG
2 series · 2 of 2 positions shown · non-contrast
Comparison: none

CLINICAL DATA: Cervical spondylosis without myelopathy with
radiculopathy. Positive response to the 2 prior cervical epidural
injections with improvement of left-sided symptoms. Worsening right
shoulder and right arm pain.

[Series 1: ortho standard · 1 of 1 slices shown (1 of 2)]
[im 1/1]
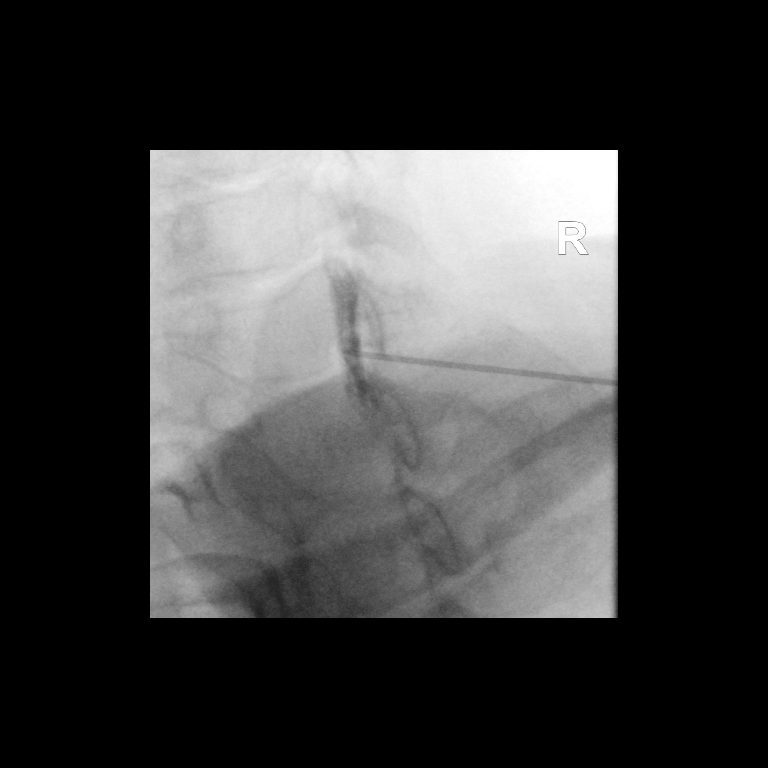

[Series 2: ortho standard · 1 of 1 slices shown (2 of 2)]
[im 1/1]
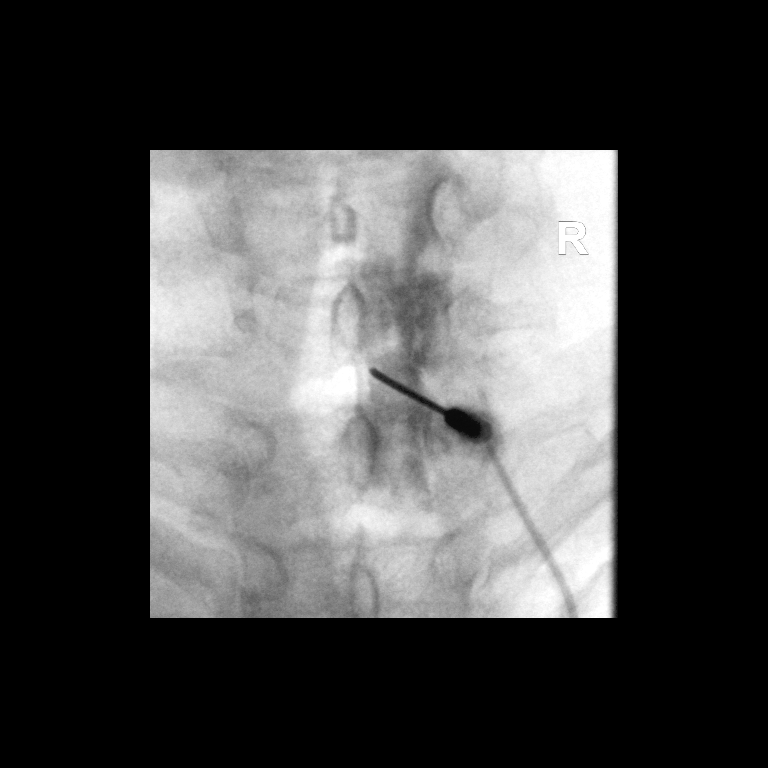

[2 of 2 positions shown; findings below may reference images not displayed]

FLUOROSCOPY TIME:  Fluoroscopy Time: 15 seconds

Radiation Exposure Index: 8.85 microGray*m^2

PROCEDURE:
The procedure, risks, benefits, and alternatives were explained to
the patient. Questions regarding the procedure were encouraged and
answered. The patient understands and consents to the procedure.

CERVICAL EPIDURAL INJECTION

An interlaminar approach was performed on the right at C7-T1. A
inch 20 gauge epidural needle was advanced using loss-of-resistance
technique.

DIAGNOSTIC EPIDURAL INJECTION

Injection of Isovue-M 300 shows a good epidural pattern with spread
above and below the level of needle placement, primarily on the
right. No vascular opacification is seen.

THERAPEUTIC EPIDURAL INJECTION

1.5 ml of Kenalog 40 mixed with 2 ml of normal saline were then
instilled. The procedure was well-tolerated, and the patient was
discharged thirty minutes following the injection in good condition.
IMPRESSION: Technically successful interlaminar epidural injection on the right
at C7-T1.

## 2022-01-17 ENCOUNTER — Telehealth: Payer: Self-pay | Admitting: Orthopedic Surgery

## 2022-01-17 NOTE — Telephone Encounter (Signed)
Pt called requesting a call be given to [facility] for peer to peer for medical equipment. Pt states she called [employer]. She is asking for [place] to contact [facility] at [phone] fax orders to [phone]. Pt phone number is [phone] ?

## 2022-01-18 NOTE — Telephone Encounter (Signed)
IC talked with patient who states that she received denial about CPM.  ?I reached out to [place] with [employer]. He will have his office reach out to insurance about appeal.  ?

## 2022-01-22 DIAGNOSIS — N179 Acute kidney failure, unspecified: Secondary | ICD-10-CM | POA: Diagnosis not present

## 2022-01-22 DIAGNOSIS — I10 Essential (primary) hypertension: Secondary | ICD-10-CM | POA: Diagnosis not present

## 2022-01-25 ENCOUNTER — Encounter: Payer: Self-pay | Admitting: Family Medicine

## 2022-01-25 DIAGNOSIS — M5412 Radiculopathy, cervical region: Secondary | ICD-10-CM

## 2022-01-28 DIAGNOSIS — L910 Hypertrophic scar: Secondary | ICD-10-CM | POA: Diagnosis not present

## 2022-01-29 DIAGNOSIS — K5730 Diverticulosis of large intestine without perforation or abscess without bleeding: Secondary | ICD-10-CM | POA: Diagnosis not present

## 2022-01-29 DIAGNOSIS — Z1211 Encounter for screening for malignant neoplasm of colon: Secondary | ICD-10-CM | POA: Diagnosis not present

## 2022-02-01 ENCOUNTER — Other Ambulatory Visit: Payer: Self-pay | Admitting: Family Medicine

## 2022-02-01 MED ORDER — GABAPENTIN 300 MG PO CAPS: 300.0000 mg | ORAL_CAPSULE | Freq: Three times a day (TID) | ORAL | 2 refills | Status: DC

## 2022-02-05 ENCOUNTER — Ambulatory Visit
Admission: RE | Admit: 2022-02-05 | Discharge: 2022-02-05 | Disposition: A | Discharge status: Home / Self Care | Payer: BC Managed Care – PPO | Source: Ambulatory Visit | Attending: Family Medicine | Admitting: Family Medicine

## 2022-02-05 DIAGNOSIS — M5412 Radiculopathy, cervical region: Secondary | ICD-10-CM

## 2022-02-05 DIAGNOSIS — L910 Hypertrophic scar: Secondary | ICD-10-CM | POA: Diagnosis not present

## 2022-02-05 DIAGNOSIS — M4722 Other spondylosis with radiculopathy, cervical region: Secondary | ICD-10-CM | POA: Diagnosis not present

## 2022-02-05 DIAGNOSIS — L570 Actinic keratosis: Secondary | ICD-10-CM | POA: Diagnosis not present

## 2022-02-05 MED ORDER — TRIAMCINOLONE ACETONIDE 40 MG/ML IJ SUSP (RADIOLOGY)
60.0000 mg | Freq: Once | INTRAMUSCULAR | Status: AC
Start: 2022-02-05 — End: 2022-02-05
  Administered 2022-02-05: 60 mg via EPIDURAL

## 2022-02-05 MED ORDER — IOPAMIDOL (ISOVUE-M 300) INJECTION 61%
1.0000 mL | Freq: Once | INTRAMUSCULAR | Status: AC | PRN
  Administered 2022-02-05: 1 mL via EPIDURAL

## 2022-02-05 NOTE — Discharge Instructions (Signed)

## 2022-02-06 ENCOUNTER — Encounter: Payer: Self-pay | Admitting: Orthopedic Surgery

## 2022-02-06 ENCOUNTER — Ambulatory Visit (INDEPENDENT_AMBULATORY_CARE_PROVIDER_SITE_OTHER): Payer: BC Managed Care – PPO | Admitting: Orthopedic Surgery

## 2022-02-06 DIAGNOSIS — M19011 Primary osteoarthritis, right shoulder: Secondary | ICD-10-CM

## 2022-02-11 NOTE — Progress Notes (Signed)
? ?Post-Op Visit Note ?  ?Patient: Kayla Daugherty           ?Date of Birth: [birth year]           ?MRN: [id number] ?Visit Date: 02/06/2022 ?PCP: Lupita Raider, MD ? ? ?Assessment & Plan: ? ?Chief Complaint:  ?Chief Complaint  ?Patient presents with  ? Right Shoulder - Routine Post Op  ? ?Visit Diagnoses:  ?1. Arthritis of right acromioclavicular joint   ? ? ?Plan: [name] is a 61 year old patient underwent right shoulder arthroscopy 01/01/2022.  She is doing exercises.  CPM is at 90 degrees.  She is 6 weeks out.  Doing home exercise program.  Taking Tylenol for pain.  On examination she has passive range of motion of 70/100/165.  Okay to start physical therapy 2 times a week for 6 weeks plus a home exercise program where we will do range of motion along with early strengthening status post distal clavicle excision and biceps tenodesis.  Follow-up in 6 weeks for clinical recheck. ? ?Follow-Up Instructions: No follow-ups on file.  ? ?Orders:  ?Orders Placed This Encounter  ?Procedures  ? Ambulatory referral to Physical Therapy  ? ?No orders of the defined types were placed in this encounter. ? ? ?Imaging: ?No results found. ? ?PMFS History: ?Patient Active Problem List  ? Diagnosis Date Noted  ? Degenerative superior labral anterior-to-posterior (SLAP) tear of right shoulder   ? Biceps tendonitis on right   ? Arthritis of right acromioclavicular joint   ? Degenerative disc disease, cervical 06/16/2021  ? Somatic dysfunction of spine, thoracic 06/16/2021  ? S/P bilateral salpingo-oophorectomy 01/10/2015  ?  Class: Status post  ? S/P cystoscopy 01/10/2015  ?  Class: Status post  ? METATARSALGIA 09/12/2010  ? FOOT PAIN, RIGHT 09/12/2010  ? ?Past Medical History:  ?Diagnosis Date  ? Anxiety   ? Arthritis   ? Family history of ovarian carcinoma   ? High risk of ovarian cancer   ? Hyperlipidemia   ? Hypertension   ? Migraines   ? Mild obstructive sleep apnea   ? study done Sept 2015--  mild osa per pt in process being  fitted for cpap  ? PONV (postoperative nausea and vomiting)   ? Wears glasses   ? Yeast dermatitis   ? abdomin-  comes and goes  ?  ?History reviewed. No pertinent family history.  ?Past Surgical History:  ?Procedure Laterality Date  ? COLONOSCOPY  2013  ? CYSTOSCOPY  01/10/2015  ? Procedure: CYSTOSCOPY;  Surgeon: Richardean Chimera, MD;  Location: [facility];  Service: Gynecology;;  ? LAPAROSCOPIC SALPINGO OOPHERECTOMY Bilateral 01/10/2015  ? Procedure: LAPAROSCOPIC SALPINGO OOPHORECTOMY;  Surgeon: Richardean Chimera, MD;  Location: [facility];  Service: Gynecology;  Laterality: Bilateral;  ? SHOULDER ARTHROSCOPY WITH OPEN ROTATOR CUFF REPAIR AND DISTAL CLAVICLE ACROMINECTOMY Right 01/01/2022  ? Procedure: RIGHT SHOULDER ARTHROSCOPY, SUBACROMIAL DECOMPRESSION, DISTAL CLAVICLE EXCISION,  BICEP TENODESIS;  Surgeon: Cammy Copa, MD;  Location: [facility];  Service: Orthopedics;  Laterality: Right;  ? TONSILLECTOMY  as child  ? ?Social History  ? ?Occupational History  ? Not on file  ?Tobacco Use  ? Smoking status: Former  ?  Years: 15.00  ?  Types: Cigarettes  ?  Quit date: 01/03/2005  ?  Years since quitting: 17.1  ? Smokeless tobacco: Never  ?Vaping Use  ? Vaping Use: Never used  ?Substance and Sexual Activity  ? Alcohol use: Yes  ?  Comment: social- rarely  ?  Drug use: No  ? Sexual activity: Not on file  ? ? ? ?

## 2022-02-20 ENCOUNTER — Ambulatory Visit: Payer: BC Managed Care – PPO | Attending: Orthopedic Surgery | Admitting: Physical Therapy

## 2022-02-20 ENCOUNTER — Encounter: Payer: Self-pay | Admitting: Physical Therapy

## 2022-02-20 DIAGNOSIS — G8929 Other chronic pain: Secondary | ICD-10-CM | POA: Diagnosis not present

## 2022-02-20 DIAGNOSIS — R278 Other lack of coordination: Secondary | ICD-10-CM | POA: Insufficient documentation

## 2022-02-20 DIAGNOSIS — R600 Localized edema: Secondary | ICD-10-CM | POA: Diagnosis not present

## 2022-02-20 DIAGNOSIS — R293 Abnormal posture: Secondary | ICD-10-CM | POA: Insufficient documentation

## 2022-02-20 DIAGNOSIS — M25511 Pain in right shoulder: Secondary | ICD-10-CM | POA: Insufficient documentation

## 2022-02-20 DIAGNOSIS — M19011 Primary osteoarthritis, right shoulder: Secondary | ICD-10-CM | POA: Insufficient documentation

## 2022-02-20 DIAGNOSIS — M6281 Muscle weakness (generalized): Secondary | ICD-10-CM | POA: Insufficient documentation

## 2022-02-20 NOTE — Therapy (Signed)
?OUTPATIENT PHYSICAL THERAPY SHOULDER EVALUATION ? ? ?Patient Name: Kayla Daugherty ?MRN: [id number] ?DOB:07/02/1961, 61 y.o., female ?Today's Date: 02/20/2022 ? ? PT End of Session - 02/20/22 1829   ? ? Visit Number 1   ? Date for PT Re-Evaluation 04/17/22   ? PT Start Time [id number]   ? PT Stop Time [id number]   ? PT Time Calculation (min) 42 min   ? Activity Tolerance Patient tolerated treatment well   ? Behavior During Therapy [facility] for tasks assessed/performed   ? ?  ?  ? ?  ? ? ?Past Medical History:  ?Diagnosis Date  ? Anxiety   ? Arthritis   ? Family history of ovarian carcinoma   ? High risk of ovarian cancer   ? Hyperlipidemia   ? Hypertension   ? Migraines   ? Mild obstructive sleep apnea   ? study done Sept 2015--  mild osa per pt in process being fitted for cpap  ? PONV (postoperative nausea and vomiting)   ? Wears glasses   ? Yeast dermatitis   ? abdomin-  comes and goes  ? ?Past Surgical History:  ?Procedure Laterality Date  ? COLONOSCOPY  2013  ? CYSTOSCOPY  01/10/2015  ? Procedure: CYSTOSCOPY;  Surgeon: Arvella Nigh, MD;  Location: [facility];  Service: Gynecology;;  ? LAPAROSCOPIC SALPINGO OOPHERECTOMY Bilateral 01/10/2015  ? Procedure: LAPAROSCOPIC SALPINGO OOPHORECTOMY;  Surgeon: Arvella Nigh, MD;  Location: [facility];  Service: Gynecology;  Laterality: Bilateral;  ? SHOULDER ARTHROSCOPY WITH OPEN ROTATOR CUFF REPAIR AND DISTAL CLAVICLE ACROMINECTOMY Right 01/01/2022  ? Procedure: RIGHT SHOULDER ARTHROSCOPY, SUBACROMIAL DECOMPRESSION, DISTAL CLAVICLE EXCISION,  BICEP TENODESIS;  Surgeon: Meredith Pel, MD;  Location: [facility];  Service: Orthopedics;  Laterality: Right;  ? TONSILLECTOMY  as child  ? ?Patient Active Problem List  ? Diagnosis Date Noted  ? Degenerative superior labral anterior-to-posterior (SLAP) tear of right shoulder   ? Biceps tendonitis on right   ? Arthritis of right acromioclavicular joint   ? Degenerative disc disease, cervical 06/16/2021  ? Somatic  dysfunction of spine, thoracic 06/16/2021  ? S/P bilateral salpingo-oophorectomy 01/10/2015  ?  Class: Status post  ? S/P cystoscopy 01/10/2015  ?  Class: Status post  ? METATARSALGIA 09/12/2010  ? FOOT PAIN, RIGHT 09/12/2010  ? ? ?PCP: Serita Grammes ? ?REFERRING PROVIDER: Meredith Pel, MD  ? ?REFERRING DIAG: M19.011 (ICD-10-CM) - Arthritis of right acromioclavicular joint  ? ?THERAPY DIAG:  ?Muscle weakness (generalized) ? ?Other lack of coordination ? ?Abnormal posture ? ?Chronic pain in right shoulder ? ?Localized edema ? ? ?ONSET DATE: 02/06/2022  ? ?SUBJECTIVE:                                                                                                                                                                                     ? ?  SUBJECTIVE STATEMENT: ?Ayrica is a 62 year old patient underwent right shoulder arthroscopy 01/01/2022. She is doing exercises. CPM is at 90 degrees. She is 6 weeks out. Doing home exercise program. Taking Tylenol for pain. On examination she has passive range of motion of 70/100/165. Okay to start physical therapy 2 times a week for 6 weeks plus a home exercise program where we will do range of motion along with early strengthening status post distal clavicle excision and biceps tenodesis. Follow-up in 6 weeks for clinical recheck.  ? ?PERTINENT HISTORY: ?See above ? ?PAIN:  ?Are you having pain? No ? ?PRECAUTIONS: Shoulder ? ?WEIGHT BEARING RESTRICTIONS No ? ?FALLS:  ?Has patient fallen in last 6 months? No ? ?LIVING ENVIRONMENT: ?Lives with: lives with their family ?Lives in: House/apartment ?Stairs: No ?Has following equipment at home: None ? 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 ? ?PLOF: Independent ? ?PATIENT GOALS Recover strength, return to golf, carry backpack on B shoulders. ? ?OBJECTIVE:  ? ? ?PATIENT SURVEYS:  ?FOTO Score 46/52 ? ?COGNITION: ? Overall cognitive status: Within functional limits for tasks  assessed ?    ?SENSATION: ?Not tested ? ?POSTURE: ?Mildly rounded shoulders and forward head. ? ?UPPER EXTREMITY ROM:  ? ?B shoulder AROM WFL to WNL in all planes ? ?UPPER EXTREMITY MMT:   Strength WNL for B elbows, wrists, hands ? ?MMT Right ?02/20/2022 Left ?02/20/2022  ?Shoulder flexion 3+ 4+  ?Shoulder extension 3+   ?Shoulder abduction 3+   ?Shoulder adduction    ?Shoulder internal rotation 3+   ?Shoulder external rotation 3+   ?    ?    ?    ?    ?    ?    ?    ?    ?    ?    ?    ?(Blank rows = not tested) ? ? ? ?JOINT MOBILITY TESTING:  ?deferred ? ?PALPATION:  ?No TTP noted. ?  ?TODAY'S TREATMENT:  ?Patient performed R shoulder flex, abd, ER, rows, and ext against yellow Tband resistance, 10 reps each. ? ? ?PATIENT EDUCATION: ?Education details: POC, HEP, concept of quality over quantity, stop for pain or strain. ?Person educated: Patient ?Education method: Explanation, Demonstration, and Handouts ?Education comprehension: verbalized understanding and returned demonstration ? ? ?[place]: ? [id number] ? ?ASSESSMENT: ? ?CLINICAL IMPRESSION: ?Patient is a 61 y.o. who was seen today for physical therapy evaluation and treatment for R shoulder scope with DCE and bicep tenodesis. She demonstrates A and PROM WFL-nearly WNL in all planes of R shoulder movement, but demonstrates weakness lifting limitations, and functional limitations for RUE use. ? ? ?OBJECTIVE IMPAIRMENTS decreased activity tolerance, decreased strength, impaired UE functional use, improper body mechanics, postural dysfunction, and pain.  ? ?ACTIVITY LIMITATIONS cleaning, laundry, and yard work.  ? ?PERSONAL FACTORS Past/current experiences are also affecting patient's functional outcome.  ? ? ?REHAB POTENTIAL: Good ? ?CLINICAL DECISION MAKING: Stable/uncomplicated ? ?EVALUATION COMPLEXITY: Low ? ? ?GOALS: ?Goals reviewed with patient? Yes ? ?SHORT TERM GOALS: Target date: 03/12/2022  (Remove Blue Hyperlink) ? ?I with initial  HEP ?Baseline: ?Goal status: INITIAL ? ? ?LONG TERM GOALS: Target date: 04/17/2022  (Remove Blue Hyperlink) ? ?I with final HEP ?Baseline:  ?Goal status: INITIAL ? ?2.  Increase FOTO score to at least 62 ?Baseline: 52/46 adjusted ?Goal status: INITIAL ? ?3.  Increase R shoulder strength to at least 4/5 in all planes ?Baseline: 3+/5 ?Goal status: INITIAL ? ? ? ?PLAN: ?PT FREQUENCY:  2x/week ? ?PT DURATION: 6 weeks ? ?PLANNED INTERVENTIONS: Therapeutic exercises, Therapeutic activity, Neuromuscular re-education, Balance training, Gait training, Patient/Family education, Joint mobilization, Dry Needling, Cryotherapy, Moist heat, Vasopneumatic device, and Manual therapy ? ?PLAN FOR NEXT SESSION: Assess tolerance to HEP and increase resistance as tolerated. ? ? ?Marcelina Morel, DPT ?02/20/2022, 6:40 PM  ?

## 2022-02-28 NOTE — Therapy (Signed)
9 OUTPATIENT PHYSICAL THERAPY SHOULDER EVALUATION   Patient Name: Kayla Daugherty MRN: [id number] DOB:Jul 09, 1961, 61 y.o., female Today's Date: 03/05/2022   PT End of Session - 03/05/22 1616     Visit Number 2    PT Start Time 1617    PT Stop Time 1700    PT Time Calculation (min) 43 min    Activity Tolerance Patient tolerated treatment well    Behavior During Therapy [facility] for tasks assessed/performed              Past Medical History:  Diagnosis Date   Anxiety    Arthritis    Family history of ovarian carcinoma    High risk of ovarian cancer    Hyperlipidemia    Hypertension    Migraines    Mild obstructive sleep apnea    study done Sept 2015--  mild osa per pt in process being fitted for cpap   PONV (postoperative nausea and vomiting)    Wears glasses    Yeast dermatitis    abdomin-  comes and goes   Past Surgical History:  Procedure Laterality Date   COLONOSCOPY  2013   CYSTOSCOPY  01/10/2015   Procedure: CYSTOSCOPY;  Surgeon: Richardean Chimera, MD;  Location: [facility] [place];  Service: Gynecology;;   LAPAROSCOPIC SALPINGO OOPHERECTOMY Bilateral 01/10/2015   Procedure: LAPAROSCOPIC SALPINGO OOPHORECTOMY;  Surgeon: Richardean Chimera, MD;  Location: [facility] [place];  Service: Gynecology;  Laterality: Bilateral;   SHOULDER ARTHROSCOPY WITH OPEN ROTATOR CUFF REPAIR AND DISTAL CLAVICLE ACROMINECTOMY Right 01/01/2022   Procedure: RIGHT SHOULDER ARTHROSCOPY, SUBACROMIAL DECOMPRESSION, DISTAL CLAVICLE EXCISION,  BICEP TENODESIS;  Surgeon: Cammy Copa, MD;  Location: [facility];  Service: Orthopedics;  Laterality: Right;   TONSILLECTOMY  as child   Patient Active Problem List   Diagnosis Date Noted   Degenerative superior labral anterior-to-posterior (SLAP) tear of right shoulder    Biceps tendonitis on right    Arthritis of right acromioclavicular joint    Degenerative disc disease, cervical 06/16/2021   Somatic dysfunction of spine, thoracic 06/16/2021    S/P bilateral salpingo-oophorectomy 01/10/2015    Class: Status post   S/P cystoscopy 01/10/2015    Class: Status post   METATARSALGIA 09/12/2010   FOOT PAIN, RIGHT 09/12/2010    PCP: Cam Hai  REFERRING PROVIDER: Cammy Copa, MD   REFERRING DIAG: M19.011 (ICD-10-CM) - Arthritis of right acromioclavicular joint   THERAPY DIAG:  Muscle weakness (generalized)  Other lack of coordination  Abnormal posture  Chronic pain in right shoulder  Localized edema   ONSET DATE: 02/06/2022   SUBJECTIVE:  SUBJECTIVE STATEMENT: Patient reports she has not been doing much since last visit. She states her R shoulder felt irritated and thinks it may be from driving or from sitting and using a computer mouse all day so she held off on HEP. No reports of pain today.  PERTINENT HISTORY: See above  PAIN:  Are you having pain? 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  PLOF: Independent  PATIENT GOALS Recover strength, return to golf, carry backpack on B shoulders.  OBJECTIVE:    PATIENT SURVEYS:  FOTO Score 46/52   UPPER EXTREMITY ROM:   B shoulder AROM WFL to WNL in all planes  UPPER EXTREMITY MMT:   Strength WNL for B elbows, wrists, hands  MMT Right 02/20/2022 Left 02/20/2022  Shoulder flexion 3+ 4+  Shoulder extension 3+   Shoulder abduction 3+   Shoulder adduction    Shoulder internal rotation 3+   Shoulder external rotation 3+                                               (Blank rows = not tested)    JOINT MOBILITY TESTING:  deferred  PALPATION:  No TTP noted.   TODAY'S TREATMENT:  03/05/22: UE Arm Bike: 3mins forward, 3 mins backwards IR stretch with towel 5sec  holds x10 Tband resistance rows, ER/IR, ext 2x12 Supine AAROM flexion w/3# rod 2x10   Standing IYT 1# 2x10    IE: Patient performed R shoulder flex, abd, ER, rows, and ext against yellow Tband resistance, 10 reps each.   PATIENT EDUCATION: Education details: POC, HEP, concept of quality over quantity, stop for pain or strain. Person educated: Patient Education method: Explanation, Demonstration, and Verbal cues Education comprehension: verbalized understanding and returned demonstration   HOME EXERCISE PROGRAM:  [id number]  ASSESSMENT:  CLINICAL IMPRESSION: Patient was seen today for physical therapy treatment for R shoulder scope with DCE and bicep tenodesis. She demonstrates ROM that is [facility] but continues to have difficulty achieving full range for ER/IR and abduction. Patient tolerated session well without complaints of pain and able to complete all therex with good form to increase range and work on strength to be able to do ADLs with ease.    OBJECTIVE IMPAIRMENTS decreased activity tolerance, decreased strength, impaired UE functional use, improper body mechanics, postural dysfunction, and pain.   ACTIVITY LIMITATIONS cleaning, laundry, and yard work.   PERSONAL FACTORS Past/current experiences are also affecting patient's functional outcome.    REHAB POTENTIAL: Good  CLINICAL DECISION MAKING: Stable/uncomplicated  EVALUATION COMPLEXITY: Low   GOALS: Goals reviewed with patient? Yes  SHORT TERM GOALS: Target date: 03/12/2022  (Remove Blue Hyperlink)  I with initial HEP Baseline: Goal status: INITIAL   LONG TERM GOALS: Target date: 04/17/2022  (Remove Blue Hyperlink)  I with final HEP Baseline:  Goal status: INITIAL  2.  Increase FOTO score to at least 62 Baseline: 52/46 adjusted Goal status: INITIAL  3.  Increase R shoulder strength to at least 4/5 in all planes Baseline: 3+/5 Goal status: INITIAL    PLAN: PT FREQUENCY: 2x/week  PT DURATION: 6  weeks  PLANNED INTERVENTIONS: Therapeutic exercises, Therapeutic activity, Neuromuscular re-education, Balance training, Gait training, Patient/Family education, Joint mobilization, Dry Needling, Cryotherapy, Moist heat, Vasopneumatic device, and Manual therapy  PLAN FOR NEXT SESSION:  Assess tolerance to HEP and continue with strengthening R shoulder, give some stretches to do   Cassie Freer, DPT 03/05/2022, 5:07 PM

## 2022-03-05 ENCOUNTER — Ambulatory Visit: Payer: BC Managed Care – PPO

## 2022-03-05 DIAGNOSIS — M19011 Primary osteoarthritis, right shoulder: Secondary | ICD-10-CM | POA: Diagnosis not present

## 2022-03-05 DIAGNOSIS — M6281 Muscle weakness (generalized): Secondary | ICD-10-CM

## 2022-03-05 DIAGNOSIS — G8929 Other chronic pain: Secondary | ICD-10-CM | POA: Diagnosis not present

## 2022-03-05 DIAGNOSIS — R600 Localized edema: Secondary | ICD-10-CM | POA: Diagnosis not present

## 2022-03-05 DIAGNOSIS — R293 Abnormal posture: Secondary | ICD-10-CM

## 2022-03-05 DIAGNOSIS — M25511 Pain in right shoulder: Secondary | ICD-10-CM | POA: Diagnosis not present

## 2022-03-05 DIAGNOSIS — R278 Other lack of coordination: Secondary | ICD-10-CM | POA: Diagnosis not present

## 2022-03-07 ENCOUNTER — Ambulatory Visit: Payer: BC Managed Care – PPO | Attending: Orthopedic Surgery

## 2022-03-07 DIAGNOSIS — M6281 Muscle weakness (generalized): Secondary | ICD-10-CM | POA: Insufficient documentation

## 2022-03-07 DIAGNOSIS — R278 Other lack of coordination: Secondary | ICD-10-CM | POA: Insufficient documentation

## 2022-03-07 DIAGNOSIS — R600 Localized edema: Secondary | ICD-10-CM | POA: Insufficient documentation

## 2022-03-07 DIAGNOSIS — R293 Abnormal posture: Secondary | ICD-10-CM | POA: Insufficient documentation

## 2022-03-07 DIAGNOSIS — M25511 Pain in right shoulder: Secondary | ICD-10-CM | POA: Diagnosis not present

## 2022-03-07 DIAGNOSIS — G8929 Other chronic pain: Secondary | ICD-10-CM | POA: Diagnosis not present

## 2022-03-07 DIAGNOSIS — M545 Low back pain, unspecified: Secondary | ICD-10-CM | POA: Insufficient documentation

## 2022-03-07 NOTE — Therapy (Signed)
[place] THERAPY SHOULDER TREATMENT   Patient Name: Kayla Daugherty MRN: [id number] DOB:[birth year], 61 y.o., female Today's Date: 03/07/2022   PT End of Session - 03/07/22 1616     Visit Number 3    PT Start Time [id number]    PT Stop Time 1655    PT Time Calculation (min) 40 min    Activity Tolerance Patient tolerated treatment well    Behavior During Therapy [facility] for tasks assessed/performed               Past Medical History:  Diagnosis Date   Anxiety    Arthritis    Family history of ovarian carcinoma    High risk of ovarian cancer    Hyperlipidemia    Hypertension    Migraines    Mild obstructive sleep apnea    study done Sept 2015--  mild osa per pt in process being fitted for cpap   PONV (postoperative nausea and vomiting)    Wears glasses    Yeast dermatitis    abdomin-  comes and goes   Past Surgical History:  Procedure Laterality Date   COLONOSCOPY  2013   CYSTOSCOPY  01/10/2015   Procedure: CYSTOSCOPY;  Surgeon: Arvella Nigh, MD;  Location: [place];  Service: Gynecology;;   LAPAROSCOPIC SALPINGO OOPHERECTOMY Bilateral 01/10/2015   Procedure: LAPAROSCOPIC SALPINGO OOPHORECTOMY;  Surgeon: Arvella Nigh, MD;  Location: [place];  Service: Gynecology;  Laterality: Bilateral;   SHOULDER ARTHROSCOPY WITH OPEN ROTATOR CUFF REPAIR AND DISTAL CLAVICLE ACROMINECTOMY Right 01/01/2022   Procedure: RIGHT SHOULDER ARTHROSCOPY, SUBACROMIAL DECOMPRESSION, DISTAL CLAVICLE EXCISION,  BICEP TENODESIS;  Surgeon: Meredith Pel, MD;  Location: [place];  Service: Orthopedics;  Laterality: Right;   TONSILLECTOMY  as child   Patient Active Problem List   Diagnosis Date Noted   Degenerative superior labral anterior-to-posterior (SLAP) tear of right shoulder    Biceps tendonitis on right    Arthritis of right acromioclavicular joint    Degenerative disc disease, cervical 06/16/2021   Somatic dysfunction of spine, thoracic 06/16/2021    S/P bilateral salpingo-oophorectomy 01/10/2015    Class: Status post   S/P cystoscopy 01/10/2015    Class: Status post   METATARSALGIA 09/12/2010   FOOT PAIN, RIGHT 09/12/2010    PCP: Serita Grammes  REFERRING PROVIDER: Meredith Pel, MD   REFERRING DIAG: M19.011 (ICD-10-CM) - Arthritis of right acromioclavicular joint   THERAPY DIAG:  Muscle weakness (generalized)  Other lack of coordination  Abnormal posture  Chronic pain in right shoulder  Localized edema   ONSET DATE: 02/06/2022   SUBJECTIVE:  SUBJECTIVE STATEMENT: Patient reports her shoulder is feeling ok, she has not been working on theraband exercises because she felt she had a good workout Tuesday.   PAIN:  Are you having pain? 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  PLOF: Independent  PATIENT GOALS Recover strength, return to golf, carry backpack on B shoulders.  OBJECTIVE:    PATIENT SURVEYS:  FOTO Score 46/52   UPPER EXTREMITY ROM:   B shoulder AROM WFL to WNL in all planes  UPPER EXTREMITY MMT:   Strength WNL for B elbows, wrists, hands  MMT Right 02/20/2022 Left 02/20/2022  Shoulder flexion 3+ 4+  Shoulder extension 3+   Shoulder abduction 3+   Shoulder adduction    Shoulder internal rotation 3+   Shoulder external rotation 3+                                               (Blank rows = not tested)    JOINT MOBILITY TESTING:  deferred  PALPATION:  No TTP noted.   TODAY'S TREATMENT:  03/07/22: UBE 3 mins each way IR towel stretch x10, 5sec holds ER towel stretch x10, 5 sec holds Doorway pec stretch 30secs Scapula lift offs 15reps GreenTB rows, ext 3x10 Single arm ER in abduction w/redTB  2x10 No money's redTB 2x12  Standing IYT 1# 2x10 Lat pull downs 2x10 20#   03/05/22: UE Arm Bike: 14mins forward, 3 mins backwards IR stretch with towel 5sec holds x10 RedTband resistance rows, ER/IR, ext 2x12 Supine AAROM flexion w/3# rod 2x10   Standing IYT 1# 2x10    IE: Patient performed R shoulder flex, abd, ER, rows, and ext against yellow Tband resistance, 10 reps each.   PATIENT EDUCATION: Education details: POC, HEP, concept of quality over quantity, stop for pain or strain. Person educated: Patient Education method: Explanation, Demonstration, and Verbal cues Education comprehension: verbalized understanding and returned demonstration   HOME EXERCISE PROGRAM:  [id number]  ASSESSMENT:  CLINICAL IMPRESSION: Patient returns for PT with no pain in R shoulder. She continues to have difficulty achieving full range for ER/IR and abduction. She says she is still having trouble lifting household item such a milk and laundry detergent. Patient is able to work through therex without pain and with good form. Will continue to work on increasing range and strength to be able to do ADLs with ease and carry a backpack over her shoulder by August.   OBJECTIVE IMPAIRMENTS decreased activity tolerance, decreased strength, impaired UE functional use, improper body mechanics, postural dysfunction, and pain.   ACTIVITY LIMITATIONS cleaning, laundry, and yard work.   PERSONAL FACTORS Past/current experiences are also affecting patient's functional outcome.    REHAB POTENTIAL: Good  CLINICAL DECISION MAKING: Stable/uncomplicated  EVALUATION COMPLEXITY: Low   GOALS: Goals reviewed with patient? Yes  SHORT TERM GOALS: Target date: 03/12/2022  (Remove Blue Hyperlink)  I with initial HEP Baseline: Goal status: INITIAL   LONG TERM GOALS: Target date: 04/17/2022  (Remove Blue Hyperlink)  I with final HEP Baseline:  Goal status: INITIAL  2.  Increase FOTO score to at least  62 Baseline: 52/46 adjusted Goal status: INITIAL  3.  Increase R shoulder strength to at least 4/5 in all planes Baseline: 3+/5 Goal status: INITIAL    PLAN: PT FREQUENCY: 2x/week  PT DURATION: 6 weeks  PLANNED INTERVENTIONS: Therapeutic exercises, Therapeutic activity, Neuromuscular re-education, Balance training, Gait training, Patient/Family education, Joint mobilization, Dry Needling, Cryotherapy, Moist heat, Vasopneumatic device, and Manual therapy  PLAN FOR NEXT SESSION: Continue with strengthening R shoulder, increase ROM    Andris Baumann, DPT 03/07/2022, 4:58 PM

## 2022-03-12 ENCOUNTER — Ambulatory Visit: Payer: BC Managed Care – PPO | Admitting: Physical Therapy

## 2022-03-12 ENCOUNTER — Ambulatory Visit: Payer: BC Managed Care – PPO

## 2022-03-12 DIAGNOSIS — M6281 Muscle weakness (generalized): Secondary | ICD-10-CM | POA: Diagnosis not present

## 2022-03-12 DIAGNOSIS — R293 Abnormal posture: Secondary | ICD-10-CM | POA: Diagnosis not present

## 2022-03-12 DIAGNOSIS — R600 Localized edema: Secondary | ICD-10-CM | POA: Diagnosis not present

## 2022-03-12 DIAGNOSIS — G8929 Other chronic pain: Secondary | ICD-10-CM

## 2022-03-12 DIAGNOSIS — M25511 Pain in right shoulder: Secondary | ICD-10-CM | POA: Diagnosis not present

## 2022-03-12 DIAGNOSIS — R278 Other lack of coordination: Secondary | ICD-10-CM | POA: Diagnosis not present

## 2022-03-12 DIAGNOSIS — M545 Low back pain, unspecified: Secondary | ICD-10-CM | POA: Diagnosis not present

## 2022-03-12 NOTE — Therapy (Addendum)
9 OUTPATIENT PHYSICAL THERAPY SHOULDER TREATMENT   Patient Name: Kayla Daugherty MRN: [id number] DOB:08-Oct-1960, 61 y.o., female Today's Date: 03/12/2022   PT End of Session - 03/12/22 1634     Visit Number 4    PT Start Time 1630    PT Stop Time 1710    PT Time Calculation (min) 40 min    Activity Tolerance Patient tolerated treatment well    Behavior During Therapy [facility] for tasks assessed/performed                Past Medical History:  Diagnosis Date   Anxiety    Arthritis    Family history of ovarian carcinoma    High risk of ovarian cancer    Hyperlipidemia    Hypertension    Migraines    Mild obstructive sleep apnea    study done Sept 2015--  mild osa per pt in process being fitted for cpap   PONV (postoperative nausea and vomiting)    Wears glasses    Yeast dermatitis    abdomin-  comes and goes   Past Surgical History:  Procedure Laterality Date   COLONOSCOPY  2013   CYSTOSCOPY  01/10/2015   Procedure: CYSTOSCOPY;  Surgeon: Richardean ChimeraJohn McComb, MD;  [facility] [place];  Service: Gynecology;;   LAPAROSCOPIC SALPINGO OOPHERECTOMY Bilateral 01/10/2015   Procedure: LAPAROSCOPIC SALPINGO OOPHORECTOMY;  Surgeon: Richardean ChimeraJohn McComb, MD;  [facility] [place];  Service: Gynecology;  Laterality: Bilateral;   SHOULDER ARTHROSCOPY WITH OPEN ROTATOR CUFF REPAIR AND DISTAL CLAVICLE ACROMINECTOMY Right 01/01/2022   Procedure: RIGHT SHOULDER ARTHROSCOPY, SUBACROMIAL DECOMPRESSION, DISTAL CLAVICLE EXCISION,  BICEP TENODESIS;  Surgeon: Cammy Copaean, Gregory Scott, MD;  Location: [facility];  Service: Orthopedics;  Laterality: Right;   TONSILLECTOMY  as child   Patient Active Problem List   Diagnosis Date Noted   Degenerative superior labral anterior-to-posterior (SLAP) tear of right shoulder    Biceps tendonitis on right    Arthritis of right acromioclavicular joint    Degenerative disc disease, cervical 06/16/2021   Somatic dysfunction of spine, thoracic 06/16/2021    S/P bilateral salpingo-oophorectomy 01/10/2015    Class: Status post   S/P cystoscopy 01/10/2015    Class: Status post   METATARSALGIA 09/12/2010   FOOT PAIN, RIGHT 09/12/2010    PCP: Cam HaiShaw, kimberly  REFERRING PROVIDER: Cammy Copaean, Gregory Scott, MD   REFERRING DIAG: M19.011 (ICD-10-CM) - Arthritis of right acromioclavicular joint   THERAPY DIAG:  Muscle weakness (generalized)  Other lack of coordination  Abnormal posture  Chronic pain in right shoulder  Localized edema   ONSET DATE: 02/06/2022   SUBJECTIVE:  SUBJECTIVE STATEMENT: Shoulder is coming along, can feel that she has been working it out, but no pain. Still having trouble carrying things.   PAIN:  Are you having pain? 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  PLOF: Independent  PATIENT GOALS Recover strength, return to golf, carry backpack on B shoulders.  OBJECTIVE:    PATIENT SURVEYS:  FOTO Score 46/52   UPPER EXTREMITY ROM:   B shoulder AROM WFL to WNL in all planes  UPPER EXTREMITY MMT:   Strength WNL for B elbows, wrists, hands  MMT Right 02/20/2022 Left 02/20/2022  Shoulder flexion 3+ 4+  Shoulder extension 3+   Shoulder abduction 3+   Shoulder adduction    Shoulder internal rotation 3+   Shoulder external rotation 3+                                               (Blank rows = not tested)   JOINT MOBILITY TESTING:  deferred  PALPATION:  No TTP noted.   TODAY'S TREATMENT:  03/12/22 UBE L2, forwards and back Lift offs 15reps Ball taps 30reps with green and red ball Green band 3 way 2x10 Body blade flexion and abd 30 sec Farmer carries 5# 1 lap, 8# 2 laps Single arm ER in abduction w/redTB  2x10 Chest press 20# 2x10 Rows 20# 2x10 Lat pull downs 20# 2x10 Tricep extension 10# 2x10  03/07/22: UBE 3 mins each way IR towel stretch x10, 5sec holds ER towel stretch x10, 5 sec holds Doorway pec stretch 30secs Scapula lift offs 15reps GreenTB rows, ext 3x10 Single arm ER in abduction w/redTB 2x10 No money's redTB 2x12  Standing IYT 1# 2x10 Lat pull downs 2x10 20#    PATIENT EDUCATION: Education details: POC, HEP, concept of quality over quantity, stop for pain or strain. Person educated: Patient Education method: Explanation, Demonstration, and Verbal cues Education comprehension: verbalized understanding and returned demonstration   HOME EXERCISE PROGRAM:  [id number]  ASSESSMENT:  CLINICAL IMPRESSION: Patient returns for PT with no pain in R shoulder. She reports that lifting or carrying objects is still difficult. Today's session continued with R shoulder abd scapular strengthening to increase endurance and strength to be able to carry and do overhead activities with ease. Pt states she has a new pain through the lateral side of her elbow, possibly coming from her triceps. Worked on some tricep extensions at end of session.    Will continue to work on increasing range and strength to be able to do ADLs with ease and carry a backpack over her shoulder by August.   OBJECTIVE IMPAIRMENTS decreased activity tolerance, decreased strength, impaired UE functional use, improper body mechanics, postural dysfunction, and pain.   ACTIVITY LIMITATIONS cleaning, laundry, and yard work.   PERSONAL FACTORS Past/current experiences are also affecting patient's functional outcome.    REHAB POTENTIAL: Good  CLINICAL DECISION MAKING: Stable/uncomplicated  EVALUATION COMPLEXITY: Low   GOALS: Goals reviewed with patient? Yes  SHORT TERM GOALS: Target date: 03/12/2022  (Remove Blue Hyperlink)  I with initial HEP Baseline: Goal status: INITIAL   LONG TERM GOALS: Target date:  04/17/2022  (Remove Blue Hyperlink)  I with final HEP Baseline:  Goal status: INITIAL  2.  Increase FOTO score to at least 62 Baseline: 52/46 adjusted Goal status:  INITIAL  3.  Increase R shoulder strength to at least 4/5 in all planes Baseline: 3+/5 Goal status: INITIAL    PLAN: PT FREQUENCY: 2x/week  PT DURATION: 6 weeks  PLANNED INTERVENTIONS: Therapeutic exercises, Therapeutic activity, Neuromuscular re-education, Balance training, Gait training, Patient/Family education, Joint mobilization, Dry Needling, Cryotherapy, Moist heat, Vasopneumatic device, and Manual therapy  PLAN FOR NEXT SESSION: Continue with strengthening R shoulder, increase ROM    Cassie Freer, DPT 03/12/2022, 5:13 PM

## 2022-03-13 ENCOUNTER — Ambulatory Visit: Payer: BC Managed Care – PPO | Admitting: Physical Therapy

## 2022-03-18 ENCOUNTER — Encounter: Payer: Self-pay | Admitting: Physical Therapy

## 2022-03-18 ENCOUNTER — Ambulatory Visit: Payer: BC Managed Care – PPO | Admitting: Physical Therapy

## 2022-03-18 DIAGNOSIS — G8929 Other chronic pain: Secondary | ICD-10-CM

## 2022-03-18 DIAGNOSIS — M545 Low back pain, unspecified: Secondary | ICD-10-CM | POA: Diagnosis not present

## 2022-03-18 DIAGNOSIS — R278 Other lack of coordination: Secondary | ICD-10-CM | POA: Diagnosis not present

## 2022-03-18 DIAGNOSIS — M6281 Muscle weakness (generalized): Secondary | ICD-10-CM | POA: Diagnosis not present

## 2022-03-18 DIAGNOSIS — R600 Localized edema: Secondary | ICD-10-CM | POA: Diagnosis not present

## 2022-03-18 DIAGNOSIS — R293 Abnormal posture: Secondary | ICD-10-CM | POA: Diagnosis not present

## 2022-03-18 DIAGNOSIS — M25511 Pain in right shoulder: Secondary | ICD-10-CM | POA: Diagnosis not present

## 2022-03-18 NOTE — Therapy (Signed)
9 OUTPATIENT PHYSICAL THERAPY SHOULDER TREATMENT   Patient Name: Kayla Daugherty MRN: [id number] DOB:1961/04/29, 61 y.o., female Today's Date: 03/18/2022   PT End of Session - 03/18/22 1640     Visit Number 5    PT Start Time 1633    PT Stop Time 1712    PT Time Calculation (min) 39 min    Activity Tolerance Patient tolerated treatment well    Behavior During Therapy [facility] for tasks assessed/performed                 Past Medical History:  Diagnosis Date   Anxiety    Arthritis    Family history of ovarian carcinoma    High risk of ovarian cancer    Hyperlipidemia    Hypertension    Migraines    Mild obstructive sleep apnea    study done Sept 2015--  mild osa per pt in process being fitted for cpap   PONV (postoperative nausea and vomiting)    Wears glasses    Yeast dermatitis    abdomin-  comes and goes   Past Surgical History:  Procedure Laterality Date   COLONOSCOPY  2013   CYSTOSCOPY  01/10/2015   Procedure: CYSTOSCOPY;  Surgeon: Richardean Chimera, MD;  Location: [facility] [employer];  Service: Gynecology;;   LAPAROSCOPIC SALPINGO OOPHERECTOMY Bilateral 01/10/2015   Procedure: LAPAROSCOPIC SALPINGO OOPHORECTOMY;  Surgeon: Richardean Chimera, MD;  Location: [facility];  Service: Gynecology;  Laterality: Bilateral;   SHOULDER ARTHROSCOPY WITH OPEN ROTATOR CUFF REPAIR AND DISTAL CLAVICLE ACROMINECTOMY Right 01/01/2022   Procedure: RIGHT SHOULDER ARTHROSCOPY, SUBACROMIAL DECOMPRESSION, DISTAL CLAVICLE EXCISION,  BICEP TENODESIS;  Surgeon: Cammy Copa, MD;  Location: [facility];  Service: Orthopedics;  Laterality: Right;   TONSILLECTOMY  as child   Patient Active Problem List   Diagnosis Date Noted   Degenerative superior labral anterior-to-posterior (SLAP) tear of right shoulder    Biceps tendonitis on right    Arthritis of right acromioclavicular joint    Degenerative disc disease, cervical 06/16/2021   Somatic dysfunction of spine, thoracic 06/16/2021    S/P bilateral salpingo-oophorectomy 01/10/2015    Class: Status post   S/P cystoscopy 01/10/2015    Class: Status post   METATARSALGIA 09/12/2010   FOOT PAIN, RIGHT 09/12/2010    PCP: Cam Hai  REFERRING PROVIDER: Cammy Copa, MD   REFERRING DIAG: M19.011 (ICD-10-CM) - Arthritis of right acromioclavicular joint   THERAPY DIAG:  Muscle weakness (generalized)  Other lack of coordination  Abnormal posture  Chronic pain in right shoulder  Localized edema   ONSET DATE: 02/06/2022   SUBJECTIVE:  SUBJECTIVE STATEMENT: Patient reports that her shoulder hurts first thing in the morning, moves through her bicep, sometimes hurts all day. Therapy does not hurt it and she is not sore after therapy.   PAIN:  Are you having pain? 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  PLOF: Independent  PATIENT GOALS Recover strength, return to golf, carry backpack on B shoulders.  OBJECTIVE:    PATIENT SURVEYS:  FOTO Score 46/52   UPPER EXTREMITY ROM:   B shoulder AROM WFL to WNL in all planes  UPPER EXTREMITY MMT:   Strength WNL for B elbows, wrists, hands  MMT Right 02/20/2022 Left 02/20/2022  Shoulder flexion 3+ 4+  Shoulder extension 3+   Shoulder abduction 3+   Shoulder adduction    Shoulder internal rotation 3+   Shoulder external rotation 3+                                               (Blank rows = not tested)   JOINT MOBILITY TESTING:  deferred  PALPATION:  No TTP noted.   TODAY'S TREATMENT:   03/18/22 UBE L2, 3 min forward/3 min back Prone scapular strengthening-ext, T, Y, 2 x 10 reps each Quadruped-Birdogs 2 x 5 reps Standing shoulder flexion facing wall with  6# weight, 2 x 10 reps Standing back to wall, 6## weight overhead ext. Shoulder flexion serratus activation with resistance. 2 x 10 reps Standing hip abd against G Tband at knees, no UE support 10 reps each. Mini squats with overhead press of 6# weight at the top, 10 reps  03/12/22 UBE L2, 3mins forwards and back Lift offs 15reps Ball taps 30reps with green and red ball Green band 3 way 2x10 Body blade flexion and abd 30 sec Farmer carries 5# 1 lap, 8# 2 laps Single arm ER in abduction w/redTB 2x10 Chest press 20# 2x10 Rows 20# 2x10 Lat pull downs 20# 2x10 Tricep extension 10# 2x10  03/07/22: UBE 3 mins each way IR towel stretch x10, 5sec holds ER towel stretch x10, 5 sec holds Doorway pec stretch 30secs Scapula lift offs 15reps GreenTB rows, ext 3x10 Single arm ER in abduction w/redTB 2x10 No money's redTB 2x12  Standing IYT 1# 2x10 Lat pull downs 2x10 20#    PATIENT EDUCATION: Education details: POC, HEP, concept of quality over quantity, stop for pain or strain. Person educated: Patient Education method: Explanation, Demonstration, and Verbal cues Education comprehension: verbalized understanding and returned demonstration   HOME EXERCISE PROGRAM:  [id number]  ASSESSMENT:  CLINICAL IMPRESSION: Patient reports very little pain with exercises, but she wakes up in the morning with Bicep pain, which sometimes lingers. She is not a huge fan of Tband exercises. Has a small gym at home. She will look at it to see which exercises are available. Performed some body weight exercises and handweights for stability.   Will continue to work on increasing range and strength to be able to do ADLs with ease and carry a backpack over her shoulder by August.   OBJECTIVE IMPAIRMENTS decreased activity tolerance, decreased strength, impaired UE functional use, improper body mechanics, postural dysfunction, and pain.   ACTIVITY LIMITATIONS cleaning, laundry, and yard work.   PERSONAL  FACTORS Past/current experiences are also affecting patient's functional outcome.    REHAB POTENTIAL: Good  CLINICAL DECISION MAKING: Stable/uncomplicated  EVALUATION COMPLEXITY: Low   GOALS: Goals reviewed with patient? Yes  SHORT TERM GOALS: Target date: 03/12/2022  (Remove Blue Hyperlink)  I with initial HEP Baseline: Goal status: INITIAL   LONG TERM GOALS: Target date: 04/17/2022  (Remove Blue Hyperlink)  I with final HEP Baseline:  Goal status: INITIAL  2.  Increase FOTO score to at least 62 Baseline: 52/46 adjusted Goal status: INITIAL  3.  Increase R shoulder strength to at least 4/5 in all planes Baseline: 3+/5 Goal status: INITIAL    PLAN: PT FREQUENCY: 2x/week  PT DURATION: 6 weeks  PLANNED INTERVENTIONS: Therapeutic exercises, Therapeutic activity, Neuromuscular re-education, Balance training, Gait training, Patient/Family education, Joint mobilization, Dry Needling, Cryotherapy, Moist heat, Vasopneumatic device, and Manual therapy  PLAN FOR NEXT SESSION: Continue with strengthening R shoulder, STM for R shoulder-biceps/triceps tightness, stretch   Oley Balm DPT 03/18/22 5:14 PM  03/18/2022, 5:14 PM

## 2022-03-20 ENCOUNTER — Encounter: Payer: Self-pay | Admitting: Orthopedic Surgery

## 2022-03-20 ENCOUNTER — Encounter: Payer: Self-pay | Admitting: Physical Therapy

## 2022-03-20 ENCOUNTER — Ambulatory Visit: Payer: BC Managed Care – PPO | Admitting: Physical Therapy

## 2022-03-20 DIAGNOSIS — R293 Abnormal posture: Secondary | ICD-10-CM

## 2022-03-20 DIAGNOSIS — M25511 Pain in right shoulder: Secondary | ICD-10-CM | POA: Diagnosis not present

## 2022-03-20 DIAGNOSIS — R278 Other lack of coordination: Secondary | ICD-10-CM | POA: Diagnosis not present

## 2022-03-20 DIAGNOSIS — R600 Localized edema: Secondary | ICD-10-CM

## 2022-03-20 DIAGNOSIS — G8929 Other chronic pain: Secondary | ICD-10-CM

## 2022-03-20 DIAGNOSIS — M6281 Muscle weakness (generalized): Secondary | ICD-10-CM

## 2022-03-20 DIAGNOSIS — M545 Low back pain, unspecified: Secondary | ICD-10-CM

## 2022-03-20 NOTE — Therapy (Signed)
[place] THERAPY SHOULDER TREATMENT   Patient Name: Kayla Daugherty MRN: [id number] DOB:1961-07-21, 61 y.o., female Today's Date: 03/20/2022   PT End of Session - 03/20/22 1637     Visit Number 6    Date for PT Re-Evaluation 04/17/22    PT Start Time 1631    PT Stop Time 6967    PT Time Calculation (min) 41 min    Activity Tolerance Patient tolerated treatment well    Behavior During Therapy [facility] for tasks assessed/performed                  Past Medical History:  Diagnosis Date   Anxiety    Arthritis    Family history of ovarian carcinoma    High risk of ovarian cancer    Hyperlipidemia    Hypertension    Migraines    Mild obstructive sleep apnea    study done Sept 2015--  mild osa per pt in process being fitted for cpap   PONV (postoperative nausea and vomiting)    Wears glasses    Yeast dermatitis    abdomin-  comes and goes   Past Surgical History:  Procedure Laterality Date   COLONOSCOPY  2013   CYSTOSCOPY  01/10/2015   Procedure: CYSTOSCOPY;  Surgeon: Arvella Nigh, MD;  Location: [place];  Service: Gynecology;;   LAPAROSCOPIC SALPINGO OOPHERECTOMY Bilateral 01/10/2015   Procedure: LAPAROSCOPIC SALPINGO OOPHORECTOMY;  Surgeon: Arvella Nigh, MD;  Location: [place];  Service: Gynecology;  Laterality: Bilateral;   SHOULDER ARTHROSCOPY WITH OPEN ROTATOR CUFF REPAIR AND DISTAL CLAVICLE ACROMINECTOMY Right 01/01/2022   Procedure: RIGHT SHOULDER ARTHROSCOPY, SUBACROMIAL DECOMPRESSION, DISTAL CLAVICLE EXCISION,  BICEP TENODESIS;  Surgeon: Meredith Pel, MD;  Location: [place];  Service: Orthopedics;  Laterality: Right;   TONSILLECTOMY  as child   Patient Active Problem List   Diagnosis Date Noted   Degenerative superior labral anterior-to-posterior (SLAP) tear of right shoulder    Biceps tendonitis on right    Arthritis of right acromioclavicular joint    Degenerative disc disease, cervical 06/16/2021   Somatic  dysfunction of spine, thoracic 06/16/2021   S/P bilateral salpingo-oophorectomy 01/10/2015    Class: Status post   S/P cystoscopy 01/10/2015    Class: Status post   METATARSALGIA 09/12/2010   FOOT PAIN, RIGHT 09/12/2010    PCP: Serita Grammes  REFERRING PROVIDER: Meredith Pel, MD   REFERRING DIAG: M19.011 (ICD-10-CM) - Arthritis of right acromioclavicular joint   THERAPY DIAG:  Muscle weakness (generalized)  Other lack of coordination  Abnormal posture  Chronic pain in right shoulder  Localized edema   ONSET DATE: 02/06/2022   SUBJECTIVE:  SUBJECTIVE STATEMENT: Patient reports no changes in her status, tolerated last treatment well with increased resistance.   PAIN:  Are you having pain? 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  PLOF: Independent  PATIENT GOALS Recover strength, return to golf, carry backpack on B shoulders.  OBJECTIVE:    PATIENT SURVEYS:  FOTO Score 46/52   UPPER EXTREMITY ROM:   B shoulder AROM WFL to WNL in all planes  UPPER EXTREMITY MMT:   Strength WNL for B elbows, wrists, hands  MMT Right 02/20/2022 Left 02/20/2022  Shoulder flexion 3+ 4+  Shoulder extension 3+   Shoulder abduction 3+   Shoulder adduction    Shoulder internal rotation 3+   Shoulder external rotation 3+                                               (Blank rows = not tested)   JOINT MOBILITY TESTING:  deferred  PALPATION:  No TTP noted.   TODAY'S TREATMENT:   03/20/22 UBE L2 3 min forward/3 min back Wall stars with red Tband, up, out, down, 10 reps each arm. Serratus push-Red physioball into wall with BUE 2 x 10 reps, VC to move slowly Power Tower- Shoulder Ext,  Rows, 2 x 10 reps, 15#, B shoulder protraction with 10# resistance, 2 x 10 Manual therapys-cross friction and passive stretch to R pects,  Vasopneumatic to R shoulder 10 min, low pressure.   03/18/22 UBE L2, 3 min forward/3 min back Prone scapular strengthening-ext, T, Y, 2 x 10 reps each Quadruped-Birdogs 2 x 5 reps Standing shoulder flexion facing wall with 6# weight, 2 x 10 reps Standing back to wall, 6## weight overhead ext. Shoulder flexion serratus activation with resistance. 2 x 10 reps Standing hip abd against G Tband at knees, no UE support 10 reps each. Mini squats with overhead press of 6# weight at the top, 10 reps  03/12/22 UBE L2, 85mns forwards and back Lift offs 15reps Ball taps 30reps with green and red ball Green band 3 way 2x10 Body blade flexion and abd 30 sec Farmer carries 5# 1 lap, 8# 2 laps Single arm ER in abduction w/redTB 2x10 Chest press 20# 2x10 Rows 20# 2x10 Lat pull downs 20# 2x10 Tricep extension 10# 2x10  03/07/22: UBE 3 mins each way IR towel stretch x10, 5sec holds ER towel stretch x10, 5 sec holds Doorway pec stretch 30secs Scapula lift offs 15reps GreenTB rows, ext 3x10 Single arm ER in abduction w/redTB 2x10 No money's redTB 2x12  Standing IYT 1# 2x10 Lat pull downs 2x10 20#    PATIENT EDUCATION: Education details: POC, HEP, concept of quality over quantity, stop for pain or strain. Person educated: Patient Education method: Explanation, Demonstration, and Verbal cues Education comprehension: verbalized understanding and returned demonstration   HOME EXERCISE PROGRAM:  [id number]  ASSESSMENT:  CLINICAL IMPRESSION: Patient reports very little pain with exercises. Continued to progressively increase strengthening, followed with STM and Vaso for post workout soreness.   Will continue to work on increasing range and strength to be able to do ADLs with ease and carry a backpack over her shoulder by August.   OBJECTIVE IMPAIRMENTS  decreased activity tolerance, decreased strength, impaired UE functional use, improper body mechanics, postural dysfunction, and pain.   ACTIVITY LIMITATIONS cleaning,  laundry, and yard work.   PERSONAL FACTORS Past/current experiences are also affecting patient's functional outcome.    REHAB POTENTIAL: Good  CLINICAL DECISION MAKING: Stable/uncomplicated  EVALUATION COMPLEXITY: Low   GOALS: Goals reviewed with patient? Yes  SHORT TERM GOALS: Target date: 03/12/2022  (Remove Blue Hyperlink)  I with initial HEP Baseline: Initial HEP provided Goal status: met   LONG TERM GOALS: Target date: 04/17/2022  (Remove Blue Hyperlink)  I with final HEP Baseline:  Goal status: INITIAL  2.  Increase FOTO score to at least 62 Baseline: 52/46 adjusted Goal status: INITIAL  3.  Increase R shoulder strength to at least 4/5 in all planes Baseline: 3+/5 Goal status: INITIAL    PLAN: PT FREQUENCY: 2x/week  PT DURATION: 6 weeks  PLANNED INTERVENTIONS: Therapeutic exercises, Therapeutic activity, Neuromuscular re-education, Balance training, Gait training, Patient/Family education, Joint mobilization, Dry Needling, Cryotherapy, Moist heat, Vasopneumatic device, and Manual therapy  PLAN FOR NEXT SESSION: Continue with strengthening R shoulder, STM for R shoulder-biceps/triceps tightness, stretch   Ethel Rana DPT 03/20/22 5:10 PM  03/20/2022, 5:10 PM

## 2022-03-21 ENCOUNTER — Other Ambulatory Visit: Payer: Self-pay | Admitting: Surgical

## 2022-03-21 MED ORDER — TIZANIDINE HCL 4 MG PO TABS
4.0000 mg | ORAL_TABLET | Freq: Two times a day (BID) | ORAL | 1 refills | Status: DC | PRN
Start: 2022-03-21 — End: 2022-05-07

## 2022-03-21 MED ORDER — DICLOFENAC SODIUM 50 MG PO TBEC: 50.0000 mg | DELAYED_RELEASE_TABLET | Freq: Two times a day (BID) | ORAL | 1 refills | Status: DC

## 2022-03-25 ENCOUNTER — Ambulatory Visit: Payer: BC Managed Care – PPO | Admitting: Physical Therapy

## 2022-03-25 ENCOUNTER — Ambulatory Visit: Payer: BC Managed Care – PPO

## 2022-03-25 DIAGNOSIS — M6281 Muscle weakness (generalized): Secondary | ICD-10-CM | POA: Diagnosis not present

## 2022-03-25 DIAGNOSIS — G8929 Other chronic pain: Secondary | ICD-10-CM

## 2022-03-25 DIAGNOSIS — R600 Localized edema: Secondary | ICD-10-CM | POA: Diagnosis not present

## 2022-03-25 DIAGNOSIS — R293 Abnormal posture: Secondary | ICD-10-CM | POA: Diagnosis not present

## 2022-03-25 DIAGNOSIS — M545 Low back pain, unspecified: Secondary | ICD-10-CM | POA: Diagnosis not present

## 2022-03-25 DIAGNOSIS — R278 Other lack of coordination: Secondary | ICD-10-CM | POA: Diagnosis not present

## 2022-03-25 DIAGNOSIS — M25511 Pain in right shoulder: Secondary | ICD-10-CM | POA: Diagnosis not present

## 2022-03-25 NOTE — Therapy (Signed)
[place] THERAPY SHOULDER TREATMENT   Patient Name: Kayla Daugherty MRN: [id number] DOB:04-04-1961, 61 y.o., female Today's Date: 03/25/2022   PT End of Session - 03/25/22 1616     Visit Number 7    Date for PT Re-Evaluation 04/17/22    PT Start Time 1616    PT Stop Time 1655    PT Time Calculation (min) 39 min    Activity Tolerance Patient tolerated treatment well    Behavior During Therapy [place] for tasks assessed/performed                   Past Medical History:  Diagnosis Date   Anxiety    Arthritis    Family history of ovarian carcinoma    High risk of ovarian cancer    Hyperlipidemia    Hypertension    Migraines    Mild obstructive sleep apnea    study done Sept 2015--  mild osa per pt in process being fitted for cpap   PONV (postoperative nausea and vomiting)    Wears glasses    Yeast dermatitis    abdomin-  comes and goes   Past Surgical History:  Procedure Laterality Date   COLONOSCOPY  2013   CYSTOSCOPY  01/10/2015   Procedure: CYSTOSCOPY;  Surgeon: Arvella Nigh, MD;  Location: [place];  Service: Gynecology;;   LAPAROSCOPIC SALPINGO OOPHERECTOMY Bilateral 01/10/2015   Procedure: LAPAROSCOPIC SALPINGO OOPHORECTOMY;  Surgeon: Arvella Nigh, MD;  Location: [place];  Service: Gynecology;  Laterality: Bilateral;   SHOULDER ARTHROSCOPY WITH OPEN ROTATOR CUFF REPAIR AND DISTAL CLAVICLE ACROMINECTOMY Right 01/01/2022   Procedure: RIGHT SHOULDER ARTHROSCOPY, SUBACROMIAL DECOMPRESSION, DISTAL CLAVICLE EXCISION,  BICEP TENODESIS;  Surgeon: Meredith Pel, MD;  Location: [place];  Service: Orthopedics;  Laterality: Right;   TONSILLECTOMY  as child   Patient Active Problem List   Diagnosis Date Noted   Degenerative superior labral anterior-to-posterior (SLAP) tear of right shoulder    Biceps tendonitis on right    Arthritis of right acromioclavicular joint    Degenerative disc disease, cervical 06/16/2021    Somatic dysfunction of spine, thoracic 06/16/2021   S/P bilateral salpingo-oophorectomy 01/10/2015    Class: Status post   S/P cystoscopy 01/10/2015    Class: Status post   METATARSALGIA 09/12/2010   FOOT PAIN, RIGHT 09/12/2010    PCP: Serita Grammes  REFERRING PROVIDER: Meredith Pel, MD   REFERRING DIAG: M19.011 (ICD-10-CM) - Arthritis of right acromioclavicular joint   THERAPY DIAG:  Muscle weakness (generalized)  Other lack of coordination  Abnormal posture  Chronic pain in right shoulder  Localized edema   ONSET DATE: 02/06/2022   SUBJECTIVE:  SUBJECTIVE STATEMENT:  Shoulder feels good but has pain in biceps and triceps mostly at night. Saw her doctor and was prescribed tizanidine.    PAIN:  Are you having pain? 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  PLOF: Independent  PATIENT GOALS Recover strength, return to golf, carry backpack on B shoulders.  OBJECTIVE:    PATIENT SURVEYS:  FOTO Score 46/52   UPPER EXTREMITY ROM:   B shoulder AROM WFL to WNL in all planes  UPPER EXTREMITY MMT:   Strength WNL for B elbows, wrists, hands  MMT Right 02/20/2022 Left 02/20/2022  Shoulder flexion 3+ 4+  Shoulder extension 3+   Shoulder abduction 3+   Shoulder adduction    Shoulder internal rotation 3+   Shoulder external rotation 3+                                               (Blank rows = not tested)   JOINT MOBILITY TESTING:  deferred  PALPATION:  No TTP noted.   TODAY'S TREATMENT:  03/25/22 UBE L2 71mn forwards/back STM to biceps and triceps  Serratus punches with cables 15# 2x10 Pec fly 10# 2x10  Bicep curls 10# 2x10 Tricep ext 35# 2x10 Elevated push ups  2x10 OHP 10# 2x10 ER 90d 10# 2x10   03/20/22 UBE L2 3 min forward/3 min back Wall stars with red Tband, up, out, down, 10 reps each arm. Serratus push-Red physioball into wall with BUE 2 x 10 reps, VC to move slowly Power Tower- Shoulder Ext, Rows, 2 x 10 reps, 15#, B shoulder protraction with 10# resistance, 2 x 10 Manual therapys-cross friction and passive stretch to R pects,  Vasopneumatic to R shoulder 10 min, low pressure.   03/18/22 UBE L2, 3 min forward/3 min back Prone scapular strengthening-ext, T, Y, 2 x 10 reps each Quadruped-Birdogs 2 x 5 reps Standing shoulder flexion facing wall with 6# weight, 2 x 10 reps Standing back to wall, 6## weight overhead ext. Shoulder flexion serratus activation with resistance. 2 x 10 reps Standing hip abd against G Tband at knees, no UE support 10 reps each. Mini squats with overhead press of 6# weight at the top, 10 reps  03/12/22 UBE L2, 375ms forwards and back Lift offs 15reps Ball taps 30reps with green and red ball Green band 3 way 2x10 Body blade flexion and abd 30 sec Farmer carries 5# 1 lap, 8# 2 laps Single arm ER in abduction w/redTB 2x10 Chest press 20# 2x10 Rows 20# 2x10 Lat pull downs 20# 2x10 Tricep extension 10# 2x10  03/07/22: UBE 3 mins each way IR towel stretch x10, 5sec holds ER towel stretch x10, 5 sec holds Doorway pec stretch 30secs Scapula lift offs 15reps GreenTB rows, ext 3x10 Single arm ER in abduction w/redTB 2x10 No money's redTB 2x12  Standing IYT 1# 2x10 Lat pull downs 2x10 20#    PATIENT EDUCATION: Education details: POC, HEP, concept of quality over quantity, stop for pain or strain. Person educated: Patient Education method: Explanation, Demonstration, and Verbal cues Education comprehension: verbalized understanding and returned demonstration   HOME EXERCISE PROGRAM:  [id number]  ASSESSMENT:  CLINICAL IMPRESSION: Patient has no pain with shoulder but is now having pain in biceps and  triceps. Did STM to biceps and triceps because she complained  of a stretching and tightness feeling. She is able to tolerate all exercises with no pain. Demonstrates difficulty with chest press on R side and ER at 90d. She states pinching in R shoulder with elevated push ups. Will continue to work on increasing range and strength to be able to do ADLs with ease and carry a backpack over her shoulder by August.   OBJECTIVE IMPAIRMENTS decreased activity tolerance, decreased strength, impaired UE functional use, improper body mechanics, postural dysfunction, and pain.   ACTIVITY LIMITATIONS cleaning, laundry, and yard work.   PERSONAL FACTORS Past/current experiences are also affecting patient's functional outcome.    REHAB POTENTIAL: Good  CLINICAL DECISION MAKING: Stable/uncomplicated  EVALUATION COMPLEXITY: Low   GOALS: Goals reviewed with patient? Yes  SHORT TERM GOALS: Target date: 03/12/2022  (Remove Blue Hyperlink)  I with initial HEP Baseline: Initial HEP provided Goal status: met   LONG TERM GOALS: Target date: 04/17/2022  (Remove Blue Hyperlink)  I with final HEP Baseline:  Goal status: INITIAL  2.  Increase FOTO score to at least 62 Baseline: 52/46 adjusted Goal status: INITIAL  3.  Increase R shoulder strength to at least 4/5 in all planes Baseline: 3+/5 Goal status: INITIAL    PLAN: PT FREQUENCY: 2x/week  PT DURATION: 6 weeks  PLANNED INTERVENTIONS: Therapeutic exercises, Therapeutic activity, Neuromuscular re-education, Balance training, Gait training, Patient/Family education, Joint mobilization, Dry Needling, Cryotherapy, Moist heat, Vasopneumatic device, and Manual therapy  PLAN FOR NEXT SESSION: Continue with strengthening R shoulder, STM for R shoulder-biceps/triceps tightness, stretch   Andris Baumann, DPT 03/25/22 4:56 PM

## 2022-03-27 ENCOUNTER — Ambulatory Visit: Payer: BC Managed Care – PPO | Admitting: Physical Therapy

## 2022-03-27 ENCOUNTER — Encounter: Payer: Self-pay | Admitting: Physical Therapy

## 2022-03-27 DIAGNOSIS — R278 Other lack of coordination: Secondary | ICD-10-CM

## 2022-03-27 DIAGNOSIS — M6281 Muscle weakness (generalized): Secondary | ICD-10-CM

## 2022-03-27 DIAGNOSIS — M545 Low back pain, unspecified: Secondary | ICD-10-CM | POA: Diagnosis not present

## 2022-03-27 DIAGNOSIS — R293 Abnormal posture: Secondary | ICD-10-CM | POA: Diagnosis not present

## 2022-03-27 DIAGNOSIS — M25511 Pain in right shoulder: Secondary | ICD-10-CM | POA: Diagnosis not present

## 2022-03-27 DIAGNOSIS — G8929 Other chronic pain: Secondary | ICD-10-CM | POA: Diagnosis not present

## 2022-03-27 DIAGNOSIS — R600 Localized edema: Secondary | ICD-10-CM

## 2022-03-27 NOTE — Therapy (Signed)
[facility] THERAPY SHOULDER TREATMENT   Patient Name: Kayla Daugherty MRN: [id number] DOB:September 15, 1961, 61 y.o., female Today's Date: 03/27/2022   PT End of Session - 03/27/22 1630     Visit Number 8    Date for PT Re-Evaluation 04/17/22    PT Start Time 1630    PT Stop Time 1706    PT Time Calculation (min) 36 min    Activity Tolerance Patient tolerated treatment well    Behavior During Therapy [facility] for tasks assessed/performed                    Past Medical History:  Diagnosis Date   Anxiety    Arthritis    Family history of ovarian carcinoma    High risk of ovarian cancer    Hyperlipidemia    Hypertension    Migraines    Mild obstructive sleep apnea    study done Sept 2015--  mild osa per pt in process being fitted for cpap   PONV (postoperative nausea and vomiting)    Wears glasses    Yeast dermatitis    abdomin-  comes and goes   Past Surgical History:  Procedure Laterality Date   COLONOSCOPY  2013   CYSTOSCOPY  01/10/2015   Procedure: CYSTOSCOPY;  Surgeon: Arvella Nigh, MD;  Location: [facility];  Service: Gynecology;;   LAPAROSCOPIC SALPINGO OOPHERECTOMY Bilateral 01/10/2015   Procedure: LAPAROSCOPIC SALPINGO OOPHORECTOMY;  Surgeon: Arvella Nigh, MD;  Location: [facility];  Service: Gynecology;  Laterality: Bilateral;   SHOULDER ARTHROSCOPY WITH OPEN ROTATOR CUFF REPAIR AND DISTAL CLAVICLE ACROMINECTOMY Right 01/01/2022   Procedure: RIGHT SHOULDER ARTHROSCOPY, SUBACROMIAL DECOMPRESSION, DISTAL CLAVICLE EXCISION,  BICEP TENODESIS;  Surgeon: Meredith Pel, MD;  Location: [facility];  Service: Orthopedics;  Laterality: Right;   TONSILLECTOMY  as child   Patient Active Problem List   Diagnosis Date Noted   Degenerative superior labral anterior-to-posterior (SLAP) tear of right shoulder    Biceps tendonitis on right    Arthritis of right acromioclavicular joint    Degenerative disc disease, cervical 06/16/2021    Somatic dysfunction of spine, thoracic 06/16/2021   S/P bilateral salpingo-oophorectomy 01/10/2015    Class: Status post   S/P cystoscopy 01/10/2015    Class: Status post   METATARSALGIA 09/12/2010   FOOT PAIN, RIGHT 09/12/2010    PCP: Serita Grammes  REFERRING PROVIDER: Meredith Pel, MD   REFERRING DIAG: M19.011 (ICD-10-CM) - Arthritis of right acromioclavicular joint   THERAPY DIAG:  Muscle weakness (generalized)  Other lack of coordination  Abnormal posture  Chronic pain in right shoulder  Localized edema   ONSET DATE: 02/06/2022   SUBJECTIVE:  SUBJECTIVE STATEMENT:  Patient reports the same pain in triceps, biceps. It fluctuates, but does appear to be improving overall.   PAIN:  Are you having pain? 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  PLOF: Independent  PATIENT GOALS Recover strength, return to golf, carry backpack on B shoulders.  OBJECTIVE:    PATIENT SURVEYS:  FOTO Score 46/52   UPPER EXTREMITY ROM:   B shoulder AROM WFL to WNL in all planes  UPPER EXTREMITY MMT:   Strength WNL for B elbows, wrists, hands  MMT Right 02/20/2022 Left 02/20/2022  Shoulder flexion 3+ 4+  Shoulder extension 3+   Shoulder abduction 3+   Shoulder adduction    Shoulder internal rotation 3+   Shoulder external rotation 3+                                               (Blank rows = not tested)   JOINT MOBILITY TESTING:  deferred  PALPATION:  No TTP noted.   TODAY'S TREATMENT:   03/27/22 UBE L2, 3 min forward, 3 min back Manual Therapy cross friction massage to R tricep, bicep, bicep tendon, STM to up traps, LS, PROM for R scapular protraction/depression, humeral  depression grade joint mobs, grade III, 2 x 10, PROM shoulder flexion. Bicep curls and tricep extensions overhead, 2 x 10 wqith 8# weights B.  03/25/22 UBE L2 42mn forwards/back STM to biceps and triceps  Serratus punches with cables 15# 2x10 Pec fly 10# 2x10  Bicep curls 10# 2x10 Tricep ext 35# 2x10 Elevated push ups 2x10 OHP 10# 2x10 ER 90d 10# 2x10   03/20/22 UBE L2 3 min forward/3 min back Wall stars with red Tband, up, out, down, 10 reps each arm. Serratus push-Red physioball into wall with BUE 2 x 10 reps, VC to move slowly Power Tower- Shoulder Ext, Rows, 2 x 10 reps, 15#, B shoulder protraction with 10# resistance, 2 x 10 Manual therapys-cross friction and passive stretch to R pects,  Vasopneumatic to R shoulder 10 min, low pressure.   03/18/22 UBE L2, 3 min forward/3 min back Prone scapular strengthening-ext, T, Y, 2 x 10 reps each Quadruped-Birdogs 2 x 5 reps Standing shoulder flexion facing wall with 6# weight, 2 x 10 reps Standing back to wall, 6## weight overhead ext. Shoulder flexion serratus activation with resistance. 2 x 10 reps Standing hip abd against G Tband at knees, no UE support 10 reps each. Mini squats with overhead press of 6# weight at the top, 10 reps  PATIENT EDUCATION: Education details: POC, HEP, concept of quality over quantity, stop for pain or strain. Person educated: Patient Education method: Explanation, Demonstration, and Verbal cues Education comprehension: verbalized understanding and returned demonstration   HOME EXERCISE PROGRAM:  [id number]  ASSESSMENT:  CLINICAL IMPRESSION: Patient reports continued discomfort in R tri and bicep at times. Continued with STM, stretch, also added cross friction massage and DN. Progressed with strengthening activities with no C/O pain. Will continue to work on increasing range and strength to be able to do ADLs with ease and carry a backpack over her shoulder by August.   OBJECTIVE IMPAIRMENTS  decreased activity tolerance, decreased strength, impaired UE functional use, improper body mechanics, postural dysfunction, and pain.   ACTIVITY LIMITATIONS cleaning, laundry, and yard work.  PERSONAL FACTORS Past/current experiences are also affecting patient's functional outcome.    REHAB POTENTIAL: Good  CLINICAL DECISION MAKING: Stable/uncomplicated  EVALUATION COMPLEXITY: Low   GOALS: Goals reviewed with patient? Yes  SHORT TERM GOALS: Target date: 03/12/2022  (Remove Blue Hyperlink)  I with initial HEP Baseline: Initial HEP provided Goal status: met   LONG TERM GOALS: Target date: 04/17/2022  (Remove Blue Hyperlink)  I with final HEP Baseline:  Goal status: INITIAL  2.  Increase FOTO score to at least 62 Baseline: 52/46 adjusted Goal status: ongoing  3.  Increase R shoulder strength to at least 4/5 in all planes Baseline: 3+/5 Goal status: Ongoing   PLAN: PT FREQUENCY: 2x/week  PT DURATION: 6 weeks  PLANNED INTERVENTIONS: Therapeutic exercises, Therapeutic activity, Neuromuscular re-education, Balance training, Gait training, Patient/Family education, Joint mobilization, Dry Needling, Cryotherapy, Moist heat, Vasopneumatic device, and Manual therapy  PLAN FOR NEXT SESSION: Assess DN and STM tolerance, pain, continue strengthening, trunk stability, overhead shoulder strength.   Ethel Rana DPT 03/27/22 5:08 PM  03/27/22 5:08 PM

## 2022-04-01 ENCOUNTER — Ambulatory Visit: Payer: BC Managed Care – PPO | Admitting: Physical Therapy

## 2022-04-03 ENCOUNTER — Ambulatory Visit: Payer: BC Managed Care – PPO | Admitting: Physical Therapy

## 2022-04-03 ENCOUNTER — Ambulatory Visit: Payer: BC Managed Care – PPO

## 2022-04-16 ENCOUNTER — Ambulatory Visit: Payer: BC Managed Care – PPO

## 2022-04-18 ENCOUNTER — Ambulatory Visit: Payer: BC Managed Care – PPO

## 2022-04-23 ENCOUNTER — Ambulatory Visit: Payer: BC Managed Care – PPO

## 2022-04-25 ENCOUNTER — Ambulatory Visit: Payer: BC Managed Care – PPO

## 2022-05-04 ENCOUNTER — Other Ambulatory Visit: Payer: Self-pay | Admitting: Surgical

## 2022-05-10 DIAGNOSIS — L298 Other pruritus: Secondary | ICD-10-CM | POA: Diagnosis not present

## 2022-05-13 ENCOUNTER — Ambulatory Visit: Payer: BC Managed Care – PPO | Admitting: Orthopedic Surgery

## 2022-06-25 DIAGNOSIS — L578 Other skin changes due to chronic exposure to nonionizing radiation: Secondary | ICD-10-CM | POA: Diagnosis not present

## 2022-06-25 DIAGNOSIS — L821 Other seborrheic keratosis: Secondary | ICD-10-CM | POA: Diagnosis not present

## 2022-06-25 DIAGNOSIS — D225 Melanocytic nevi of trunk: Secondary | ICD-10-CM | POA: Diagnosis not present

## 2022-06-25 DIAGNOSIS — L814 Other melanin hyperpigmentation: Secondary | ICD-10-CM | POA: Diagnosis not present

## 2022-06-25 DIAGNOSIS — L570 Actinic keratosis: Secondary | ICD-10-CM | POA: Diagnosis not present

## 2022-07-16 ENCOUNTER — Other Ambulatory Visit: Payer: Self-pay | Admitting: Family Medicine

## 2022-07-16 MED ORDER — GABAPENTIN 300 MG PO CAPS: 300.0000 mg | ORAL_CAPSULE | Freq: Three times a day (TID) | ORAL | 2 refills | Status: DC

## 2022-07-17 ENCOUNTER — Ambulatory Visit: Payer: BC Managed Care – PPO | Admitting: Orthopedic Surgery

## 2022-07-24 ENCOUNTER — Ambulatory Visit: Payer: BC Managed Care – PPO | Admitting: Orthopedic Surgery

## 2022-07-31 ENCOUNTER — Ambulatory Visit: Payer: BC Managed Care – PPO | Admitting: Orthopedic Surgery

## 2022-08-07 ENCOUNTER — Ambulatory Visit: Payer: Self-pay

## 2022-08-07 ENCOUNTER — Ambulatory Visit (INDEPENDENT_AMBULATORY_CARE_PROVIDER_SITE_OTHER): Payer: BC Managed Care – PPO

## 2022-08-07 ENCOUNTER — Ambulatory Visit: Payer: BC Managed Care – PPO | Admitting: Surgical

## 2022-08-07 DIAGNOSIS — M25511 Pain in right shoulder: Secondary | ICD-10-CM

## 2022-08-07 DIAGNOSIS — M25551 Pain in right hip: Secondary | ICD-10-CM

## 2022-08-07 DIAGNOSIS — G8929 Other chronic pain: Secondary | ICD-10-CM

## 2022-08-07 DIAGNOSIS — M79604 Pain in right leg: Secondary | ICD-10-CM

## 2022-08-08 ENCOUNTER — Other Ambulatory Visit: Payer: Self-pay

## 2022-08-08 DIAGNOSIS — M25511 Pain in right shoulder: Secondary | ICD-10-CM

## 2022-08-08 DIAGNOSIS — M542 Cervicalgia: Secondary | ICD-10-CM

## 2022-08-11 ENCOUNTER — Encounter: Payer: Self-pay | Admitting: Orthopedic Surgery

## 2022-08-11 MED ORDER — LIDOCAINE HCL 1 % IJ SOLN
5.0000 mL | INTRAMUSCULAR | Status: AC | PRN
  Administered 2022-08-07: 5 mL

## 2022-08-11 MED ORDER — BUPIVACAINE HCL 0.25 % IJ SOLN
4.0000 mL | INTRAMUSCULAR | Status: AC | PRN
  Administered 2022-08-07: 4 mL via INTRA_ARTICULAR

## 2022-08-11 MED ORDER — METHYLPREDNISOLONE ACETATE 40 MG/ML IJ SUSP
40.0000 mg | INTRAMUSCULAR | Status: AC | PRN
  Administered 2022-08-07: 40 mg via INTRA_ARTICULAR

## 2022-08-11 NOTE — Progress Notes (Signed)
Office Visit Note   Patient: Kayla Daugherty           Date of Birth: 03-Jun-1961           MRN: [id number] Visit Date: 08/07/2022 Requested by: Lupita Raider, MD [address] [place],  [place] [address] PCP: Lupita Raider, MD  Subjective: Chief Complaint  Patient presents with   Right Shoulder - Routine Post Op   Right Leg - Pain    HPI: Kayla Daugherty is a 61 y.o. female who presents to the office reporting right shoulder pain and right hip pain.  Patient states that she has had fairly persistent right shoulder pain over the last several months that she localizes to the anterior bicep region with radiation from the anterior aspect of the shoulder.  Pain does not wake her up at night.  She has history of prior right shoulder arthroscopy with distal clavicle excision and bicep tenodesis on 01/01/2022.  She has history of prior right shoulder nonarthrogram MRI that demonstrated tendinosis of the supraspinatus tendon with some bursal fraying along the anterior aspect of the tendon.  The tendon was noted to be intact on arthroscopy back in March.  She has not had any new injury to the right shoulder.  She has tried her postop exercises without any significant resolution of her pain.  In addition to the right shoulder pain, she describes right hip pain that she has noticed increasingly over the last 7 to 10 days.  She localizes pain to the posterior aspect of the buttock with some radiation to the groin and down to the lateral aspect of her knee.  She also has some lateral hip pain as well.  Pain is worse with bending forward and with hip flexion.  Symptoms are getting worse and she states pain is keeping her up at night as she cannot really lay on her right hip for more than a couple minutes before she has to flip onto her left side or onto her back.  No history of prior hip or back surgery.  She has no left-sided symptoms.  No numbness or tingling.  She denies any low back  pain..                ROS: All systems reviewed are negative as they relate to the chief complaint within the history of present illness.  Patient denies fevers or chills.  Assessment & Plan: Visit Diagnoses:  1. Greater trochanteric pain syndrome of right lower extremity   2. Chronic right shoulder pain     Plan: Patient is a 61 year old female who presents for evaluation of right shoulder and right hip pain primarily.  Right shoulder pain has been ongoing for several months without relief of pain from postop exercises that she has tried for the last several months.  She had ultrasound examination today in the clinic that demonstrated the bicep tendon still appears to be well fixed in the bicipital groove.  There is some hypoechoic signal noted within the supraspinatus tendon primarily which may represent progression of rotator cuff tendinopathy compared with prior MRI scan.  Prior MRI was nonarthrogram so plan to evaluate for rotator cuff pathology with MRI arthrogram of the right shoulder.  Follow-up to review MRI.  Regarding her right hip pain that has been worsening over the last week, she mainly has pain and exam findings that localized to the greater trochanter.  Has difficulty laying on her right hip at night for more  than a couple minutes due to her pain.  She has reproduction of pain with resisted hip abduction and internal rotation in the lateral position as well as moderate tenderness over the greater trochanter.  After discussion of options, she would like to try ultrasound-guided trochanteric injection today.  She was counseled on the small but not insignificant risk of gluteal tendon rupture from cortisone injection.  She would still like to proceed.  The anterior, lateral, posterior facets of the greater trochanter were identified with ultrasound probe with patient in the lateral position; 22-gauge spinal needle was utilized to successfully inject the trochanteric bursa with no  significant resistance to injection.  Care was taken to avoid the gluteal tendon insertions.  She tolerated procedure well.  If she has no lasting relief from this injection or injection is short-lived, plan for further evaluation with MRI of pelvis.   Patient also has history of of C-spine multiple degenerative changes as well as multilevel bilateral foraminal stenosis and facet arthropathy.  She has had worsening neck pain and would like to have repeat ESI.  She has had previous ESI at [facility] with the last injection on 02/05/2022.  She does not take any blood thinners.  She will be set back up for this through [facility].  Follow-Up Instructions: No follow-ups on file.   Orders:  Orders Placed This Encounter  Procedures   XR HIP UNILAT W OR W/O PELVIS 2-3 VIEWS RIGHT   XR Lumbar Spine 2-3 Views   US Guided Needle Placement - No Linked Charges   No orders of the defined types were placed in this encounter.     Procedures: Large Joint Inj: R greater trochanter on 08/07/2022 2:45 PM Indications: pain and diagnostic evaluation Details: 18 G 3.5 in needle, ultrasound-guided lateral approach  Arthrogram: No  Medications: 5 mL lidocaine 1 %; 4 mL bupivacaine 0.25 %; 40 mg methylPREDNISolone acetate 40 MG/ML Outcome: tolerated well, no immediate complications Procedure, treatment alternatives, risks and benefits explained, specific risks discussed. Consent was given by the patient. Immediately prior to procedure a time out was called to verify the correct patient, procedure, equipment, support staff and site/side marked as required. Patient was prepped and draped in the usual sterile fashion.       Clinical Data: No additional findings.  Objective: Vital Signs: LMP 04/26/2014 Comment: irregular periods per pt   Physical Exam:  Constitutional: Patient appears well-developed HEENT:  Head: Normocephalic Eyes:EOM are normal Neck: Normal range of  motion Cardiovascular: Normal rate Pulmonary/chest: Effort normal Neurologic: Patient is alert Skin: Skin is warm Psychiatric: Patient has normal mood and affect  Ortho Exam: Ortho exam demonstrates right shoulder exam with passive range of motion of 45 degrees external rotation, 100 degrees abduction, 150 degrees forward flexion.  She has no significant coarse grinding or crepitus noted with passive motion of the right shoulder.  Incisions are well-healed from prior arthroscopy and mini open approach.  Axillary nerve is intact with deltoid firing.  Excellent supraspinatus, infraspinatus, subscapularis strength noted with some reproduction of pain with supraspinatus strength testing.  She has no Popeye deformity noted.  Mild tenderness over the area of the bicep tenodesis.  No reproduction of pain with resisted bicep flexion or supination.  She has intact 5/5 EPL, FPL, finger abduction, wrist extension, pronation/supination, bicep, tricep, deltoid.  No tenderness over the [facility] joint.  No pain with cervical spine range of motion.  Negative Lhermitte sign.  Negative Spurling sign.  Right hip exam demonstrates negative  Stinchfield sign.  Negative FADIR sign.  No pain with hip range of motion primarily.  She has mild tenderness over the right SI joint.  Moderate tenderness over the right greater trochanter.  No calf tenderness.  Negative Homans' sign.  She has 5/5 motor strength of hip flexion, quadricep, hamstring, dorsiflexion, plantarflexion of bilateral lower extremities.  She has reproduction of pain with resisted hip abduction in the lateral position.  She also has reproduction of pain with resisted hip internal rotation against gravity.  No tenderness throughout the axial lumbar spine.  Specialty Comments:  No specialty comments available.  Imaging: No results found.   PMFS History: Patient Active Problem List   Diagnosis Date Noted   Degenerative superior labral anterior-to-posterior (SLAP)  tear of right shoulder    Biceps tendonitis on right    Arthritis of right acromioclavicular joint    Degenerative disc disease, cervical 06/16/2021   Somatic dysfunction of spine, thoracic 06/16/2021   S/P bilateral salpingo-oophorectomy 01/10/2015    Class: Status post   S/P cystoscopy 01/10/2015    Class: Status post   METATARSALGIA 09/12/2010   FOOT PAIN, RIGHT 09/12/2010   Past Medical History:  Diagnosis Date   Anxiety    Arthritis    Family history of ovarian carcinoma    High risk of ovarian cancer    Hyperlipidemia    Hypertension    Migraines    Mild obstructive sleep apnea    study done Sept 2015--  mild osa per pt in process being fitted for cpap   PONV (postoperative nausea and vomiting)    Wears glasses    Yeast dermatitis    abdomin-  comes and goes    No family history on file.  Past Surgical History:  Procedure Laterality Date   COLONOSCOPY  2013   CYSTOSCOPY  01/10/2015   Procedure: CYSTOSCOPY;  Surgeon: Richardean Chimera, MD;  Location: [facility];  Service: Gynecology;;   LAPAROSCOPIC SALPINGO OOPHERECTOMY Bilateral 01/10/2015   Procedure: LAPAROSCOPIC SALPINGO OOPHORECTOMY;  Surgeon: Richardean Chimera, MD;  Location: [facility];  Service: Gynecology;  Laterality: Bilateral;   SHOULDER ARTHROSCOPY WITH OPEN ROTATOR CUFF REPAIR AND DISTAL CLAVICLE ACROMINECTOMY Right 01/01/2022   Procedure: RIGHT SHOULDER ARTHROSCOPY, SUBACROMIAL DECOMPRESSION, DISTAL CLAVICLE EXCISION,  BICEP TENODESIS;  Surgeon: Cammy Copa, MD;  [facility];  Service: Orthopedics;  Laterality: Right;   TONSILLECTOMY  as child   Social History   Occupational History   Not on file  Tobacco Use   Smoking status: Former    Years: 15.00    Types: Cigarettes    Quit date: 01/03/2005    Years since quitting: 17.6   Smokeless tobacco: Never  Vaping Use   Vaping Use: Never used  Substance and Sexual Activity   Alcohol use: Yes    Comment: social- rarely    Drug use: No   Sexual activity: Not on file

## 2022-08-13 ENCOUNTER — Other Ambulatory Visit: Payer: Self-pay | Admitting: Orthopedic Surgery

## 2022-08-13 DIAGNOSIS — M542 Cervicalgia: Secondary | ICD-10-CM

## 2022-08-20 ENCOUNTER — Other Ambulatory Visit: Payer: BC Managed Care – PPO

## 2022-08-24 DIAGNOSIS — Z1231 Encounter for screening mammogram for malignant neoplasm of breast: Secondary | ICD-10-CM | POA: Diagnosis not present

## 2022-09-03 ENCOUNTER — Ambulatory Visit
Admission: RE | Admit: 2022-09-03 | Discharge: 2022-09-03 | Disposition: A | Discharge status: Home / Self Care | Payer: BC Managed Care – PPO | Source: Ambulatory Visit | Attending: Orthopedic Surgery | Admitting: Orthopedic Surgery

## 2022-09-03 DIAGNOSIS — M542 Cervicalgia: Secondary | ICD-10-CM

## 2022-09-03 DIAGNOSIS — M47812 Spondylosis without myelopathy or radiculopathy, cervical region: Secondary | ICD-10-CM | POA: Diagnosis not present

## 2022-09-03 MED ORDER — IOPAMIDOL (ISOVUE-M 300) INJECTION 61%
1.0000 mL | Freq: Once | INTRAMUSCULAR | Status: AC
  Administered 2022-09-03: 1 mL via EPIDURAL

## 2022-09-03 MED ORDER — TRIAMCINOLONE ACETONIDE 40 MG/ML IJ SUSP (RADIOLOGY)
60.0000 mg | Freq: Once | INTRAMUSCULAR | Status: AC
  Administered 2022-09-03: 60 mg via EPIDURAL

## 2022-09-03 NOTE — Discharge Instructions (Signed)

## 2022-09-21 DIAGNOSIS — R21 Rash and other nonspecific skin eruption: Secondary | ICD-10-CM | POA: Diagnosis not present

## 2022-09-27 ENCOUNTER — Other Ambulatory Visit: Payer: Self-pay | Admitting: Family Medicine

## 2022-09-27 MED ORDER — GABAPENTIN 300 MG PO CAPS: 300.0000 mg | ORAL_CAPSULE | Freq: Three times a day (TID) | ORAL | 2 refills | Status: DC

## 2022-10-04 DIAGNOSIS — Z6829 Body mass index (BMI) 29.0-29.9, adult: Secondary | ICD-10-CM | POA: Diagnosis not present

## 2022-10-04 DIAGNOSIS — G4733 Obstructive sleep apnea (adult) (pediatric): Secondary | ICD-10-CM | POA: Diagnosis not present

## 2022-10-23 ENCOUNTER — Other Ambulatory Visit: Payer: Self-pay | Admitting: Family Medicine

## 2022-10-23 MED ORDER — GABAPENTIN 300 MG PO CAPS: 300.0000 mg | ORAL_CAPSULE | Freq: Three times a day (TID) | ORAL | 2 refills | Status: AC

## 2022-11-22 DIAGNOSIS — H2513 Age-related nuclear cataract, bilateral: Secondary | ICD-10-CM | POA: Diagnosis not present

## 2022-11-22 DIAGNOSIS — H35433 Paving stone degeneration of retina, bilateral: Secondary | ICD-10-CM | POA: Diagnosis not present

## 2022-11-22 DIAGNOSIS — H524 Presbyopia: Secondary | ICD-10-CM | POA: Diagnosis not present

## 2022-11-22 DIAGNOSIS — H04123 Dry eye syndrome of bilateral lacrimal glands: Secondary | ICD-10-CM | POA: Diagnosis not present

## 2022-11-22 DIAGNOSIS — H43393 Other vitreous opacities, bilateral: Secondary | ICD-10-CM | POA: Diagnosis not present

## 2022-12-02 ENCOUNTER — Ambulatory Visit: Payer: Self-pay

## 2022-12-02 ENCOUNTER — Ambulatory Visit: Payer: BC Managed Care – PPO | Admitting: Family Medicine

## 2022-12-02 ENCOUNTER — Encounter: Payer: Self-pay | Admitting: Family Medicine

## 2022-12-02 ENCOUNTER — Ambulatory Visit (INDEPENDENT_AMBULATORY_CARE_PROVIDER_SITE_OTHER): Payer: BC Managed Care – PPO

## 2022-12-02 VITALS — BP 116/74 | HR 55 | Ht 66.0 in | Wt 171.0 lb

## 2022-12-02 DIAGNOSIS — M2550 Pain in unspecified joint: Secondary | ICD-10-CM | POA: Diagnosis not present

## 2022-12-02 DIAGNOSIS — M25561 Pain in right knee: Secondary | ICD-10-CM

## 2022-12-02 DIAGNOSIS — M79641 Pain in right hand: Secondary | ICD-10-CM

## 2022-12-02 LAB — CK: Total CK: 118 U/L (ref 7–177)

## 2022-12-02 LAB — SEDIMENTATION RATE: Sed Rate: 20 mm/hr (ref 0–30)

## 2022-12-02 NOTE — Patient Instructions (Addendum)
Thank you for coming in today.   Please get an Xray today before you leave   Please get labs today before you leave   You received an injection today. Seek immediate medical attention if the joint becomes red, extremely painful, or is oozing fluid.   I've referred you to Occupational Therapy for hand therapy.  Let us know if you don't hear from them in one week.   Check back in 8 weeks

## 2022-12-02 NOTE — Progress Notes (Unsigned)
I, Josepha Pigg, CMA acting as a scribe for Lynne Leader, MD.  Kayla Daugherty is a 62 y.o. female who presents to [facility] at [facility] today for right thumb and right knee pain.  Patient was previously seen by Dr. Georgina Snell on 12/18/2021 for cervical radiculopathy.  Today, patient presents with right knee pain x several months, occurring more regularly.  Patient locates pain to the lateral aspect of the knee.   R knee swelling: no Mechanical symptoms: yes Radiates: no Aggravates: flexion, ascending stairs, sit-to-stand. Treatments tried: Gabapentin, IBU, Naproxen  Pt also c/o R thumb pain x 6 months. Pt locates pain to base of the thumb  Grip strength: s/p right shoulder surgery with Dr. Marlou Sa Paresthesias: no Aggravates: computer work Treatments tried: CBD  Pertinent review of systems: No fevers or chills  Relevant historical information: Right shoulder SLAP tear history.   Exam:  BP 116/74   Pulse (!) 55   Ht '5\' 6"'$  (1.676 m)   Wt 171 lb (77.6 kg)   LMP 04/26/2014 Comment: irregular periods per pt   SpO2 98%   BMI 27.60 kg/m  General: Well Developed, well nourished, and in no acute distress.   MSK: Right hand some bossing at first [facility].  Tender palpation at first [facility] area.  Normal hand motion and strength.  Pulses cap refill sensation intact distally.  Right knee: Normal-appearing Normal motion with crepitation.  Tender palpation medial joint line. Stable ligamentous exam. Intact strength.    Lab and Radiology Results   X-ray images right hand and right knee obtained today personally and independently interpreted  Right hand: DJD present first [facility].  Mild.  No acute fractures visible.  Right knee: Mild medial patellofemoral DJD.  No acute fractures.  Await formal radiology review   Procedure: Real-time Ultrasound Guided Injection of right knee superior lateral patellar space Device: Philips Affiniti 50G Images permanently stored and  available for review in PACS Verbal informed consent obtained.  Discussed risks and benefits of procedure. Warned about infection, bleeding, hyperglycemia damage to structures among others. Patient expresses understanding and agreement Time-out conducted.   Noted no overlying erythema, induration, or other signs of local infection.   Skin prepped in a sterile fashion.   Local anesthesia: Topical Ethyl chloride.   With sterile technique and under real time ultrasound guidance: 40 mg of Kenalog and 2 mL of Marcaine injected into knee joint. Fluid seen entering the joint capsule.   Completed without difficulty   Pain immediately resolved suggesting accurate placement of the medication.   Advised to call if fevers/chills, erythema, induration, drainage, or persistent bleeding.   Images permanently stored and available for review in the ultrasound unit.  Impression: Technically successful ultrasound guided injection.   [employer] MSK Ultrasound of: Right first [facility]MC Mild joint effusion at first [facility] and STT joint.  Impression: DJD CMC and STT joints present.     Assessment and Plan: 62 y.o. female with right hand and knee pain.  Pain thought to be generally due to DJD.  Hand pain: DJD at first [facility] area.  She is a great candidate for trial of hand therapy.  Recommend Voltaren gel and refer to OT.  Knee pain thought to be exacerbation of mild DJD.  She may have a degenerative meniscus tear as well.  Plan for steroid injection.  She has had multiple other orthopedic pains over the last year or 2.  Overall this presents a polyarthralgia pattern.  I think the most  likely explanation just isolated orthopedic issues but there could be a rheumatologic problem.  Will proceed with rheumatologic workup listed below.  Recheck in 8 weeks.   PDMP not reviewed this encounter. Orders Placed This Encounter  Procedures   [place] LIMITED JOINT SPACE STRUCTURES LOW RIGHT(NO LINKED CHARGES)    Order  Specific Question:   Reason for Exam (SYMPTOM  OR DIAGNOSIS REQUIRED)    Answer:   right knee pain    Order Specific Question:   Preferred imaging location?    Answer:   [facility]   DG Knee AP/LAT W/[place] Right    Standing Status:   Future    Number of Occurrences:   1    Standing Expiration Date:   12/31/2022    Order Specific Question:   Reason for Exam (SYMPTOM  OR DIAGNOSIS REQUIRED)    Answer:   right knee pain    Order Specific Question:   Preferred imaging location?    Answer:   [facility]   DG Hand Complete Right    Standing Status:   Future    Number of Occurrences:   1    Standing Expiration Date:   12/03/2023    Order Specific Question:   Reason for Exam (SYMPTOM  OR DIAGNOSIS REQUIRED)    Answer:   eval hand r    Order Specific Question:   Preferred imaging location?    Answer:   [facility]   Cyclic citrul peptide antibody, IgG    Standing Status:   Future    Number of Occurrences:   1    Standing Expiration Date:   12/03/2023   Sedimentation rate    Standing Status:   Future    Number of Occurrences:   1    Standing Expiration Date:   12/03/2023   CK    Standing Status:   Future    Number of Occurrences:   1    Standing Expiration Date:   12/03/2023   Rheumatoid factor    Standing Status:   Future    Number of Occurrences:   1    Standing Expiration Date:   12/03/2023   ANA    Standing Status:   Future    Number of Occurrences:   1    Standing Expiration Date:   12/03/2023   Ambulatory referral to Occupational Therapy    Referral Priority:   Routine    Referral Type:   Occupational Therapy    Referral Reason:   Specialty Services Required    Requested Specialty:   Occupational Therapy    Number of Visits Requested:   1   No orders of the defined types were placed in this encounter.    Discussed warning signs or symptoms. Please see discharge instructions. Patient expresses understanding.   The above  documentation has been reviewed and is accurate and complete Lynne Leader, M.D.

## 2022-12-03 NOTE — Progress Notes (Signed)
Radiology sees what looks to be an old fracture at the very end of your thumb.  This is not where you hurt.  I do not think what radiology sees is the source of your pain because you do not hurt there.  Hand therapy has a good chance of getting your thumb feeling better.

## 2022-12-03 NOTE — Progress Notes (Signed)
Right knee x-ray looks normal to radiology

## 2022-12-04 LAB — ANA: Anti Nuclear Antibody (ANA): NEGATIVE

## 2022-12-04 LAB — RHEUMATOID FACTOR: Rheumatoid fact SerPl-aCnc: <14 IU/mL (ref <14)

## 2022-12-04 LAB — CYCLIC CITRUL PEPTIDE ANTIBODY, IGG: Cyclic Citrullin Peptide Ab: <16 UNITS

## 2022-12-05 NOTE — Progress Notes (Signed)
Rheumatologic labs are normal.

## 2023-01-15 DIAGNOSIS — E782 Mixed hyperlipidemia: Secondary | ICD-10-CM | POA: Diagnosis not present

## 2023-01-15 DIAGNOSIS — R7303 Prediabetes: Secondary | ICD-10-CM | POA: Diagnosis not present

## 2023-01-15 DIAGNOSIS — Z0000 Encounter for general adult medical examination without abnormal findings: Secondary | ICD-10-CM | POA: Diagnosis not present

## 2023-01-15 DIAGNOSIS — I10 Essential (primary) hypertension: Secondary | ICD-10-CM | POA: Diagnosis not present

## 2023-01-27 ENCOUNTER — Ambulatory Visit: Payer: BC Managed Care – PPO | Admitting: Family Medicine

## 2023-01-31 NOTE — Progress Notes (Deleted)
   Rubin Payor, PhD, LAT, ATC acting as a scribe for Clementeen Graham, MD.  Kayla Daugherty is a 62 y.o. female who presents to [facility] at [facility] today for 8-wk f/u R knee and R thumb pain and polyarthralgia. Pt was last seen by Dr. Denyse Amass on 12/02/22 and was given a right knee steroid injection, advised to use Voltaren gel, and referred to OT, but was unable to schedule visits due to timing conflict with work.  Rheumatological lab work was also obtained.  Today, patient reports***  Dx: 12/02/2022 R hand & R knee XR and labs  Pertinent review of systems: ***  Relevant historical information: ***   Exam:  LMP 04/26/2014 Comment: irregular periods per pt  General: Well Developed, well nourished, and in no acute distress.   MSK: ***    Lab and Radiology Results No results found for this or any previous visit (from the past 72 hour(s)). No results found.     Assessment and Plan: 62 y.o. female with ***   PDMP not reviewed this encounter. No orders of the defined types were placed in this encounter.  No orders of the defined types were placed in this encounter.    Discussed warning signs or symptoms. Please see discharge instructions. Patient expresses understanding.   ***

## 2023-02-03 ENCOUNTER — Ambulatory Visit: Payer: BC Managed Care – PPO | Admitting: Family Medicine

## 2023-03-13 ENCOUNTER — Other Ambulatory Visit: Payer: Self-pay | Admitting: Family Medicine

## 2023-03-13 DIAGNOSIS — I350 Nonrheumatic aortic (valve) stenosis: Secondary | ICD-10-CM

## 2023-04-14 ENCOUNTER — Other Ambulatory Visit: Payer: BC Managed Care – PPO

## 2023-04-14 ENCOUNTER — Ambulatory Visit
Admission: RE | Admit: 2023-04-14 | Discharge: 2023-04-14 | Disposition: A | Discharge status: Home / Self Care | Payer: BC Managed Care – PPO | Source: Ambulatory Visit | Attending: Family Medicine | Admitting: Family Medicine

## 2023-04-14 DIAGNOSIS — E785 Hyperlipidemia, unspecified: Secondary | ICD-10-CM | POA: Diagnosis not present

## 2023-04-14 DIAGNOSIS — I7090 Unspecified atherosclerosis: Secondary | ICD-10-CM | POA: Diagnosis not present

## 2023-04-14 DIAGNOSIS — H539 Unspecified visual disturbance: Secondary | ICD-10-CM | POA: Diagnosis not present

## 2023-04-14 DIAGNOSIS — I10 Essential (primary) hypertension: Secondary | ICD-10-CM | POA: Diagnosis not present

## 2023-04-14 DIAGNOSIS — I350 Nonrheumatic aortic (valve) stenosis: Secondary | ICD-10-CM

## 2023-06-11 NOTE — Progress Notes (Unsigned)
   Rubin Payor, PhD, LAT, ATC acting as a scribe for Clementeen Graham, MD.  Kayla Daugherty is a 62 y.o. female who presents to [facility] at [facility] today for cont'd R hand pain. Pt was last seen by Dr. Denyse Amass on 12/02/22 and was given a R knee steroid injection and was advised to use Voltaren gel and was referred to OT, but she has not been able to schedule any visits due to time conflicts. Labs were also obtained.   Today, pt reports ***. Pt locates pain to ***  Dx testing: 12/02/22 R hand XR and labs  Pertinent review of systems: ***  Relevant historical information: ***   Exam:  LMP 04/26/2014 Comment: irregular periods per pt  General: Well Developed, well nourished, and in no acute distress.   MSK: ***    Lab and Radiology Results No results found for this or any previous visit (from the past 72 hour(s)). No results found.     Assessment and Plan: 62 y.o. female with ***   PDMP not reviewed this encounter. No orders of the defined types were placed in this encounter.  No orders of the defined types were placed in this encounter.    Discussed warning signs or symptoms. Please see discharge instructions. Patient expresses understanding.   ***

## 2023-06-12 ENCOUNTER — Ambulatory Visit (INDEPENDENT_AMBULATORY_CARE_PROVIDER_SITE_OTHER): Payer: BC Managed Care – PPO

## 2023-06-12 ENCOUNTER — Ambulatory Visit: Payer: BC Managed Care – PPO | Admitting: Family Medicine

## 2023-06-12 ENCOUNTER — Encounter: Payer: Self-pay | Admitting: Family Medicine

## 2023-06-12 ENCOUNTER — Other Ambulatory Visit: Payer: Self-pay

## 2023-06-12 VITALS — BP 92/64 | HR 59 | Ht 66.0 in | Wt 158.0 lb

## 2023-06-12 DIAGNOSIS — M25562 Pain in left knee: Secondary | ICD-10-CM

## 2023-06-12 DIAGNOSIS — G8929 Other chronic pain: Secondary | ICD-10-CM | POA: Diagnosis not present

## 2023-06-12 DIAGNOSIS — M25561 Pain in right knee: Secondary | ICD-10-CM

## 2023-06-12 DIAGNOSIS — M79645 Pain in left finger(s): Secondary | ICD-10-CM

## 2023-06-12 DIAGNOSIS — M19042 Primary osteoarthritis, left hand: Secondary | ICD-10-CM | POA: Diagnosis not present

## 2023-06-12 NOTE — Patient Instructions (Signed)
Thank you for coming in today.  You received an injection today. Seek immediate medical attention if the joint becomes red, extremely painful, or is oozing fluid.  Please get an Xray today before you leave  

## 2023-06-23 NOTE — Progress Notes (Signed)
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  No fractures are visible.

## 2023-06-23 NOTE — Progress Notes (Signed)
Left knee x-ray looks okay to radiology

## 2023-06-26 DIAGNOSIS — H6502 Acute serous otitis media, left ear: Secondary | ICD-10-CM | POA: Diagnosis not present

## 2023-06-26 DIAGNOSIS — H9203 Otalgia, bilateral: Secondary | ICD-10-CM | POA: Diagnosis not present

## 2023-06-26 DIAGNOSIS — J029 Acute pharyngitis, unspecified: Secondary | ICD-10-CM | POA: Diagnosis not present

## 2023-06-26 DIAGNOSIS — B349 Viral infection, unspecified: Secondary | ICD-10-CM | POA: Diagnosis not present

## 2023-07-14 DIAGNOSIS — L814 Other melanin hyperpigmentation: Secondary | ICD-10-CM | POA: Diagnosis not present

## 2023-07-14 DIAGNOSIS — L570 Actinic keratosis: Secondary | ICD-10-CM | POA: Diagnosis not present

## 2023-07-14 DIAGNOSIS — D225 Melanocytic nevi of trunk: Secondary | ICD-10-CM | POA: Diagnosis not present

## 2023-07-14 DIAGNOSIS — L820 Inflamed seborrheic keratosis: Secondary | ICD-10-CM | POA: Diagnosis not present

## 2023-07-14 DIAGNOSIS — L821 Other seborrheic keratosis: Secondary | ICD-10-CM | POA: Diagnosis not present

## 2023-07-14 DIAGNOSIS — Z86018 Personal history of other benign neoplasm: Secondary | ICD-10-CM | POA: Diagnosis not present

## 2023-09-13 DIAGNOSIS — Z1231 Encounter for screening mammogram for malignant neoplasm of breast: Secondary | ICD-10-CM | POA: Diagnosis not present

## 2024-01-07 DIAGNOSIS — H43393 Other vitreous opacities, bilateral: Secondary | ICD-10-CM | POA: Diagnosis not present

## 2024-01-07 DIAGNOSIS — H04123 Dry eye syndrome of bilateral lacrimal glands: Secondary | ICD-10-CM | POA: Diagnosis not present

## 2024-01-07 DIAGNOSIS — H25813 Combined forms of age-related cataract, bilateral: Secondary | ICD-10-CM | POA: Diagnosis not present

## 2024-01-07 DIAGNOSIS — H524 Presbyopia: Secondary | ICD-10-CM | POA: Diagnosis not present

## 2024-01-07 DIAGNOSIS — H35433 Paving stone degeneration of retina, bilateral: Secondary | ICD-10-CM | POA: Diagnosis not present

## 2024-01-15 DIAGNOSIS — L570 Actinic keratosis: Secondary | ICD-10-CM | POA: Diagnosis not present

## 2024-02-11 DIAGNOSIS — Z0000 Encounter for general adult medical examination without abnormal findings: Secondary | ICD-10-CM | POA: Diagnosis not present

## 2024-02-11 DIAGNOSIS — R7303 Prediabetes: Secondary | ICD-10-CM | POA: Diagnosis not present

## 2024-02-11 DIAGNOSIS — E782 Mixed hyperlipidemia: Secondary | ICD-10-CM | POA: Diagnosis not present

## 2024-02-11 DIAGNOSIS — I10 Essential (primary) hypertension: Secondary | ICD-10-CM | POA: Diagnosis not present

## 2024-02-11 DIAGNOSIS — Z23 Encounter for immunization: Secondary | ICD-10-CM | POA: Diagnosis not present

## 2024-04-14 DIAGNOSIS — M542 Cervicalgia: Secondary | ICD-10-CM | POA: Diagnosis not present

## 2024-04-14 DIAGNOSIS — S0093XA Contusion of unspecified part of head, initial encounter: Secondary | ICD-10-CM | POA: Diagnosis not present

## 2024-04-14 DIAGNOSIS — R519 Headache, unspecified: Secondary | ICD-10-CM | POA: Diagnosis not present

## 2024-04-28 DIAGNOSIS — S060X9A Concussion with loss of consciousness of unspecified duration, initial encounter: Secondary | ICD-10-CM | POA: Diagnosis not present

## 2024-04-28 DIAGNOSIS — M542 Cervicalgia: Secondary | ICD-10-CM | POA: Diagnosis not present

## 2024-04-28 DIAGNOSIS — L219 Seborrheic dermatitis, unspecified: Secondary | ICD-10-CM | POA: Diagnosis not present

## 2024-04-28 DIAGNOSIS — H9319 Tinnitus, unspecified ear: Secondary | ICD-10-CM | POA: Diagnosis not present

## 2024-06-22 DIAGNOSIS — I10 Essential (primary) hypertension: Secondary | ICD-10-CM | POA: Diagnosis not present

## 2024-06-22 DIAGNOSIS — R0683 Snoring: Secondary | ICD-10-CM | POA: Diagnosis not present

## 2024-06-22 DIAGNOSIS — G4700 Insomnia, unspecified: Secondary | ICD-10-CM | POA: Diagnosis not present

## 2024-07-15 DIAGNOSIS — R519 Headache, unspecified: Secondary | ICD-10-CM | POA: Diagnosis not present

## 2024-07-29 DIAGNOSIS — G4733 Obstructive sleep apnea (adult) (pediatric): Secondary | ICD-10-CM | POA: Diagnosis not present

## 2024-08-09 DIAGNOSIS — G4733 Obstructive sleep apnea (adult) (pediatric): Secondary | ICD-10-CM | POA: Diagnosis not present

## 2024-08-19 DIAGNOSIS — R92333 Mammographic heterogeneous density, bilateral breasts: Secondary | ICD-10-CM | POA: Diagnosis not present

## 2024-08-19 DIAGNOSIS — Z1231 Encounter for screening mammogram for malignant neoplasm of breast: Secondary | ICD-10-CM | POA: Diagnosis not present
